# Patient Record
Sex: Female | Born: 1965 | Race: White | Hispanic: No | Marital: Married | Smoking: Never smoker
Health system: Southern US, Community
[De-identification: ages and names within clinical notes are randomized; demographics above are authoritative.]

## PROBLEM LIST (undated history)

## (undated) DIAGNOSIS — C50919 Malignant neoplasm of unspecified site of unspecified female breast: Secondary | ICD-10-CM

## (undated) DIAGNOSIS — Z789 Other specified health status: Secondary | ICD-10-CM

## (undated) DIAGNOSIS — M1990 Unspecified osteoarthritis, unspecified site: Secondary | ICD-10-CM

## (undated) DIAGNOSIS — Z923 Personal history of irradiation: Secondary | ICD-10-CM

## (undated) HISTORY — DX: Unspecified osteoarthritis, unspecified site: M19.90

## (undated) HISTORY — PX: NO PAST SURGERIES: SHX2092

## (undated) HISTORY — DX: Malignant neoplasm of unspecified site of unspecified female breast: C50.919

---

## 2005-11-09 ENCOUNTER — Other Ambulatory Visit: Admission: RE | Admit: 2005-11-09 | Discharge: 2005-11-09 | Payer: Self-pay | Admitting: *Deleted

## 2014-06-11 ENCOUNTER — Other Ambulatory Visit: Payer: Self-pay | Admitting: *Deleted

## 2014-06-11 DIAGNOSIS — N63 Unspecified lump in unspecified breast: Secondary | ICD-10-CM

## 2014-06-17 ENCOUNTER — Other Ambulatory Visit: Payer: Self-pay | Admitting: *Deleted

## 2014-06-17 DIAGNOSIS — N63 Unspecified lump in unspecified breast: Secondary | ICD-10-CM

## 2014-06-20 ENCOUNTER — Ambulatory Visit
Admission: RE | Admit: 2014-06-20 | Discharge: 2014-06-20 | Disposition: A | Discharge status: Home / Self Care | Payer: BC Managed Care – PPO | Source: Ambulatory Visit | Attending: *Deleted | Admitting: *Deleted

## 2014-06-20 ENCOUNTER — Encounter (INDEPENDENT_AMBULATORY_CARE_PROVIDER_SITE_OTHER): Payer: Self-pay

## 2014-06-20 DIAGNOSIS — N63 Unspecified lump in unspecified breast: Secondary | ICD-10-CM

## 2014-06-20 DIAGNOSIS — C50919 Malignant neoplasm of unspecified site of unspecified female breast: Secondary | ICD-10-CM

## 2014-06-20 HISTORY — DX: Malignant neoplasm of unspecified site of unspecified female breast: C50.919

## 2014-06-21 ENCOUNTER — Other Ambulatory Visit: Payer: Self-pay | Admitting: *Deleted

## 2014-06-21 DIAGNOSIS — C50912 Malignant neoplasm of unspecified site of left female breast: Secondary | ICD-10-CM

## 2014-06-27 ENCOUNTER — Telehealth: Payer: Self-pay | Admitting: *Deleted

## 2014-06-27 DIAGNOSIS — C50212 Malignant neoplasm of upper-inner quadrant of left female breast: Secondary | ICD-10-CM

## 2014-06-27 NOTE — Telephone Encounter (Signed)
Confirmed [facility] for 07/03/14 at 12N .  Instructions and contact information given. 

## 2014-07-02 ENCOUNTER — Ambulatory Visit
Admission: RE | Admit: 2014-07-02 | Discharge: 2014-07-02 | Disposition: A | Discharge status: Home / Self Care | Payer: BC Managed Care – PPO | Source: Ambulatory Visit | Attending: *Deleted | Admitting: *Deleted

## 2014-07-02 DIAGNOSIS — C50912 Malignant neoplasm of unspecified site of left female breast: Secondary | ICD-10-CM

## 2014-07-02 MED ORDER — GADOBENATE DIMEGLUMINE 529 MG/ML IV SOLN
20.0000 mL | Freq: Once | INTRAVENOUS | Status: AC | PRN
  Administered 2014-07-02: 20 mL via INTRAVENOUS

## 2014-07-03 ENCOUNTER — Encounter: Payer: Self-pay | Admitting: Physical Therapy

## 2014-07-03 ENCOUNTER — Ambulatory Visit (HOSPITAL_BASED_OUTPATIENT_CLINIC_OR_DEPARTMENT_OTHER): Payer: BC Managed Care – PPO | Admitting: Hematology and Oncology

## 2014-07-03 ENCOUNTER — Encounter: Payer: Self-pay | Admitting: Hematology and Oncology

## 2014-07-03 ENCOUNTER — Ambulatory Visit: Payer: BC Managed Care – PPO | Attending: General Surgery | Admitting: Physical Therapy

## 2014-07-03 ENCOUNTER — Other Ambulatory Visit (HOSPITAL_BASED_OUTPATIENT_CLINIC_OR_DEPARTMENT_OTHER): Payer: BC Managed Care – PPO

## 2014-07-03 ENCOUNTER — Other Ambulatory Visit (INDEPENDENT_AMBULATORY_CARE_PROVIDER_SITE_OTHER): Payer: Self-pay | Admitting: General Surgery

## 2014-07-03 ENCOUNTER — Ambulatory Visit
Admission: RE | Admit: 2014-07-03 | Discharge: 2014-07-03 | Disposition: A | Discharge status: Home / Self Care | Payer: BC Managed Care – PPO | Source: Ambulatory Visit | Attending: Radiation Oncology | Admitting: Radiation Oncology

## 2014-07-03 ENCOUNTER — Other Ambulatory Visit: Payer: Self-pay | Admitting: Hematology and Oncology

## 2014-07-03 ENCOUNTER — Ambulatory Visit: Payer: BC Managed Care – PPO

## 2014-07-03 VITALS — BP 163/97 | HR 80 | Temp 98.4°F | Resp 18 | Ht 66.25 in | Wt 236.1 lb

## 2014-07-03 DIAGNOSIS — Z01818 Encounter for other preprocedural examination: Secondary | ICD-10-CM

## 2014-07-03 DIAGNOSIS — Z170 Estrogen receptor positive status [ER+]: Secondary | ICD-10-CM

## 2014-07-03 DIAGNOSIS — C50212 Malignant neoplasm of upper-inner quadrant of left female breast: Secondary | ICD-10-CM | POA: Insufficient documentation

## 2014-07-03 DIAGNOSIS — M439 Deforming dorsopathy, unspecified: Secondary | ICD-10-CM | POA: Diagnosis not present

## 2014-07-03 DIAGNOSIS — M1990 Unspecified osteoarthritis, unspecified site: Secondary | ICD-10-CM | POA: Diagnosis not present

## 2014-07-03 LAB — COMPREHENSIVE METABOLIC PANEL (CC13)
ALBUMIN: 3.8 g/dL (ref 3.5–5.0)
ALT: 51 U/L (ref 0–55)
AST: 38 U/L — ABNORMAL HIGH (ref 5–34)
Alkaline Phosphatase: 79 U/L (ref 40–150)
Anion Gap: 10 mEq/L (ref 3–11)
BUN: 8.7 mg/dL (ref 7.0–26.0)
CALCIUM: 9.6 mg/dL (ref 8.4–10.4)
CHLORIDE: 103 meq/L (ref 98–109)
CO2: 30 meq/L — AB (ref 22–29)
Creatinine: 0.9 mg/dL (ref 0.6–1.1)
GLUCOSE: 93 mg/dL (ref 70–140)
Potassium: 3.6 mEq/L (ref 3.5–5.1)
SODIUM: 142 meq/L (ref 136–145)
TOTAL PROTEIN: 7.5 g/dL (ref 6.4–8.3)
Total Bilirubin: 0.46 mg/dL (ref 0.20–1.20)

## 2014-07-03 LAB — CBC WITH DIFFERENTIAL/PLATELET
BASO%: 0.7 % (ref 0.0–2.0)
Basophils Absolute: 0.1 10*3/uL (ref 0.0–0.1)
EOS%: 1.6 % (ref 0.0–7.0)
Eosinophils Absolute: 0.1 10*3/uL (ref 0.0–0.5)
HCT: 41.8 % (ref 34.8–46.6)
HGB: 13.7 g/dL (ref 11.6–15.9)
LYMPH#: 1.8 10*3/uL (ref 0.9–3.3)
LYMPH%: 20.8 % (ref 14.0–49.7)
MCH: 28.6 pg (ref 25.1–34.0)
MCHC: 32.7 g/dL (ref 31.5–36.0)
MCV: 87.3 fL (ref 79.5–101.0)
MONO#: 0.9 10*3/uL (ref 0.1–0.9)
MONO%: 9.9 % (ref 0.0–14.0)
NEUT#: 5.9 10*3/uL (ref 1.5–6.5)
NEUT%: 67 % (ref 38.4–76.8)
Platelets: 247 10*3/uL (ref 145–400)
RBC: 4.78 10*6/uL (ref 3.70–5.45)
RDW: 13.6 % (ref 11.2–14.5)
WBC: 8.8 10*3/uL (ref 3.9–10.3)

## 2014-07-03 NOTE — Assessment & Plan Note (Signed)
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 After reviewing the pathology in detail, we proceeded to discuss the different treatment options between surgery, radiation, chemotherapy, antiestrogen therapies.  Recommendation: Surgery followed by Oncotype DX testing if appropriate, followed by radiation and antiestrogen therapy  Oncotype DX counseling:I discussed Oncotype DX test. I explained to the patient that this is a 21 gene panel to evaluate patient tumors DNA to calculate recurrence score. This would help determine whether patient has high risk or intermediate risk or low risk breast cancer. She understands that if her tumor was found to be high risk, she would benefit from systemic chemotherapy. If low risk, no need of chemotherapy. If she was found to be intermediate risk, we would need to evaluate the score as well as other risk factors and determine if an abbreviated chemotherapy may be of benefit.  Return to clinic one week after surgery to discuss final pathology.

## 2014-07-03 NOTE — Therapy (Signed)
Physical Therapy Evaluation  Patient Details  Name: Mary Eaton MRN: [id number] Date of Birth: 1966/07/05  Encounter Date: 07/03/2014      PT End of Session - 07/03/14 1658    Visit Number 1   Number of Visits 1   PT Start Time 1430   PT Stop Time 1500   PT Time Calculation (min) 30 min   Activity Tolerance Patient tolerated treatment well      Past Medical History  Diagnosis Date  . Breast cancer   . Arthritis     History reviewed. No pertinent past surgical history.  There were no vitals taken for this visit.  Visit Diagnosis:  Breast cancer of upper-inner quadrant of left female breast - Plan: PT plan of care cert/re-cert  Postural deformity - Plan: PT plan of care cert/re-cert      Subjective Assessment - 07/03/14 1648    Symptoms Patient was seen today in the breast multi-disciplinary clinic for initial assessments.  She was diagnosed with left breast cancer.   Pertinent History Patient was diagnosed with left upper inner quadrant breast cancer which was ER/PR positive, HER2 negative on 06/28/14.   Currently in Pain? Yes   Pain Score 8    Pain Location Hip   Pain Orientation Left   Pain Descriptors / Indicators Sharp;Jabbing   Pain Type Other (Comment)  intermittent   Pain Onset More than a month ago   Pain Frequency Intermittent   Aggravating Factors  bending and getting up from bending   Pain Relieving Factors changing positions   Effect of Pain on Daily Activities No effect   Multiple Pain Sites No          [facility] PT Assessment - 07/03/14 1600    Assessment   Medical Diagnosis Left breast cancer   Onset Date 06/28/14   Precautions   Precautions Other (comment)  active cancer   Balance Screen   Has the patient fallen in the past 6 months No   Has the patient had a decrease in activity level because of a fear of falling?  No   Is the patient reluctant to leave their home because of a fear of falling?  No   Home Environment   Family/patient  expects to be discharged to: Private residence   Living Arrangements Spouse/significant other;Children   Prior Function   Level of Independence Independent with basic ADLs   Cognition   Overall Cognitive Status Within Functional Limits for tasks assessed   Posture/Postural Control   Posture/Postural Control Postural limitations   Postural Limitations rounded shoulders and forward head posture   AROM   Right Shoulder Extension 51 Degrees   Right Shoulder Flexion 145 Degrees   Right Shoulder ABduction 164 Degrees   Right Shoulder Internal Rotation 62 Degrees   Right Shoulder External Rotation 67 Degrees   Left Shoulder Extension 55 Degrees   Left Shoulder Flexion 147 Degrees   Left Shoulder ABduction 142 Degrees   Left Shoulder Internal Rotation 60 Degrees   Left Shoulder External Rotation 71 Degrees   Strength   Overall Strength Within functional limits for tasks performed            Education - 07/03/14 1658    Education provided Yes   Education Details Post op breast cancer surgery shoulder range of motion home exercise program   Education Details Patient;Spouse   Methods Explanation;Demonstration;Handout   Comprehension Verbalized understanding;Returned demonstration  Plan - 07/03/14 1659    Clinical Impression Statement Patient was seen today in the [facility] for initial assessments.  She was very receptive to all information given.  She had baseline measurements performed for shoulder range of motion, posture, and lymphedema.   Pt will benefit from skilled therapeutic intervention in order to improve on the following deficits Decreased range of motion;Decreased knowledge of precautions;Impaired UE functional use;Increased edema  if needed following surgery.   Rehab Potential Excellent   Clinical Impairments Affecting Rehab Potential none   PT Frequency Other (Comment)  eval only   PT Plan Follow up as needed following surgery.         Problem List Patient Active Problem List   Diagnosis Date Noted  . Breast cancer of upper-inner quadrant of left female breast 07/03/2014            LYMPHEDEMA/ONCOLOGY QUESTIONNAIRE - 07/03/14 1600    Cancer Type Left upper inner quadrant breast cancer   Lymphedema Assessments Upper extremities   10 cm Proximal to Olecranon Process 33.5 cm   Olecranon Process 29.3 cm   10 cm Proximal to Ulnar Styloid Process 25.5. cm   Just Proximal to Ulnar Styloid Process 17.8 cm   Across Hand at [employer] 19.2 cm   At [place] of 2nd Digit 6.6 cm   10 cm Proximal to Olecranon Process 33.1 cm   Olecranon Process 27.2 cm   10 cm Proximal to Ulnar Styloid Process 23.8 cm   Just Proximal to Ulnar Styloid Process 16.9 cm   Across Hand at [employer] 19.8 cm   At [place] of 2nd Digit 6.7 cm       Patient will follow up at outpatient cancer rehab if needed following surgery.  If the patient requires physical therapy at that time, a specific plan will be dictated and sent to the referring physician for approval. The patient was educated today on appropriate basic range of motion exercises to begin post operatively and the importance of attending the After Breast Cancer class following surgery.  Patient was educated today on lymphedema risk reduction practices as it pertains to recommendations that will benefit the patient immediately following surgery.  She verbalized good understanding.  No additional physical therapy is indicated at this time.          Breast Clinic Goals - 07/03/14 1702    Patient will be able to verbalize understanding of pertinent lymphedema risk reduction practices relevant to her diagnosis specifically related to skin care.   Time 1   Period Days   Status Achieved   Patient will be able to return demonstrate and/or verbalize understanding of the post-op home exercise program related to regaining shoulder range of motion.   Time 1   Period Days   Status  Achieved   Patient will be able to verbalize understanding of the importance of attending the postoperative After Breast Cancer Class for further lymphedema risk reduction education and therapeutic exercise.   Time 1   Period Days   Status Achieved                Audry Pecina,MARTI COOPER 07/03/2014, 5:04 PM

## 2014-07-03 NOTE — Patient Instructions (Signed)
Physical Therapy Information for After Breast Cancer Surgery/Treatment:   Lymphedema is a swelling condition that you may be at risk for in your arm if you have lymph nodes removed from the armpit area.  After a sentinel node biopsy, the risk is approximately 5-9% and is higher after an axillary node dissection.  There is treatment available for this condition and it is not life-threatening.  Contact your physician or physical therapist with concerns.  You may begin the 4 shoulder/posture exercises (see additional sheet) when permitted by your physician (typically a week after surgery).  If you have drains, you may need to wait until those are removed before beginning range of motion exercises.  A general recommendation is to not lift your arms above shoulder height until drains are removed.  These exercises should be done to your tolerance and gently.  This is not a "no pain/no gain" type of recovery so listen to your body and stretch into the range of motion that you can tolerate, stopping if you have pain.  If you are having immediate reconstruction, ask your plastic surgeon about doing exercises as he or she may want you to wait.  We encourage you to attend the free one time ABC (After Breast Cancer) class offered by [facility].  You will learn information related to lymphedema risk, prevention and treatment and additional exercises to regain mobility following surgery.  You can call [phone] for more information.  This is offered the 1st and 3rd Monday of each month.  You only attend the class one time.  While undergoing any medical procedure or treatment, try to avoid blood pressure being taken or needle sticks from occurring on the arm on the side of cancer.   This recommendation begins after surgery and continues for the rest of your life.  This may help reduce your risk of getting lymphedema (swelling in your arm).  An excellent resource for those seeking  information on lymphedema is the National Lymphedema Network's web site. It can be accessed at www.lymphnet.org  If you notice swelling in your hand, arm or breast at any time following surgery (even if it is many years from now), please contact your doctor or physical therapist to discuss this.  Lymphedema can be treated at any time but it is easier for you if it is treated early on.  If you feel like your shoulder motion is not returning to normal in a reasonable amount of time, please contact your surgeon or physical therapist.  Marti C. Damaria Stofko, PT, CLT [phone]; [address] [place], [place] [address] ABC CLASS After Breast Cancer Class  After Breast Cancer Class is a specially designed exercise class to assist you in a safe recover after having breast cancer surgery.  In this class you will learn how to get back to full function whether your drains were just removed or if you had surgery a month ago.  This one-time class is held the 1st and 3rd Monday of every month from 11:00 a.m. until 12:00 noon at the [facility] located at [address] [place], [place] [address]  This class is FREE and space is limited. For more information or to register for the next available class, call [phone].  Class Goals   Understand specific stretches to improve the flexibility of you chest and shoulder.  Learn ways to safely strengthen your upper body and improve your posture.  Understand the warning signs of infection and why   you may be at risk for an arm infection.  Learn about Lymphedema and prevention.  ** You do not attend this class until after surgery.  Drains must be removed to participate  Patient was instructed today in a home exercise program today for post op shoulder range of motion. She was encouraged to do these when permitted by her physician. 

## 2014-07-03 NOTE — Progress Notes (Signed)
Checked in new pt with no financial concerns at this time. I informed pt if chemo is part of her treatment Raquel will call her ins to see if Josem Kaufmann is req and will obtain that if it is as well as get in touch with different foundations that offer copay assistance for chemo if needed.  She has Raquel's card for any questions or concerns she may have in the future.

## 2014-07-03 NOTE — Progress Notes (Signed)
[facility] NOTE  Patient Care Team: Jasmine December, NP as PCP - General Jasmine December, NP as Nurse Practitioner Rulon Eisenmenger, MD as Consulting Physician (Hematology and Oncology) Fanny Skates, MD as Consulting Physician (General Surgery) Rexene Edison, MD as Consulting Physician (Radiation Oncology)  CHIEF COMPLAINTS/PURPOSE OF CONSULTATION:  Newly diagnosed breast cancer  HISTORY OF PRESENTING ILLNESS:  Mary Eaton 48 y.o. female is here because of recent diagnosis of left breast cancer. Patient had a screening mammogram that revealed a left breast mass at 11:00 position which by ultrasound measured 1.4 cm. She underwent a biopsy that revealed a grade 1 invasive ductal carcinoma with DCIS, ER PR positive HER-2 negative with a Ki-67 of 80%. MRI of the breasts was performed showed postbiopsy changes and 1.4 cm abnormality in the breast. She was presented this morning at the multidisciplinary tumor board and is here today to discuss the treatment plan at [facility] clinic. She is here today accompanied by her husband.  I reviewed her records extensively and collaborated the history with the patient.  SUMMARY OF ONCOLOGIC HISTORY:   Breast cancer of upper-inner quadrant of left female breast   06/20/2014 Initial Diagnosis Left breast biopsy 11:00: IDC with DCIS grade 1, ER 100%, PR percent, Ki-67 80%, HER-2 negative, ratio 1.4 it   07/02/2014 Breast MRI Left breast irregular mass 11:00 1.4 x 1 x 0.8 cm with non-mass enhancement medially and anteriorly extending 4.5 cm, no lymph nodes    In terms of breast cancer risk profile:  She menarched at early age of 49, on birth control and not having periods  She had 2 pregnancy, her first child was born at age 56  She has received birth control pills for approximately 28 years.  She was never exposed to fertility medications or hormone replacement therapy.  She has no family history of Breast/GYN/GI cancer  MEDICAL HISTORY:   Past Medical History  Diagnosis Date  . Breast cancer   . Arthritis     SURGICAL HISTORY: History reviewed. No pertinent past surgical history.  SOCIAL HISTORY: History   Social History  . Marital Status: Married    Spouse Name: N/A    Number of Children: N/A  . Years of Education: N/A   Occupational History  . Not on file.   Social History Main Topics  . Smoking status: Never Smoker   . Smokeless tobacco: Not on file  . Alcohol Use: No  . Drug Use: No  . Sexual Activity: Not on file   Other Topics Concern  . Not on file   Social History Narrative  . No narrative on file    FAMILY HISTORY: Family History  Problem Relation Age of Onset  . Lung cancer Mother     ALLERGIES:  has No Known Allergies.  MEDICATIONS:  Current Outpatient Prescriptions  Medication Sig Dispense Refill  . ibuprofen (ADVIL,MOTRIN) 100 MG tablet Take 100 mg by mouth every 6 (six) hours as needed for fever.    . norethindrone (JOLIVETTE) 0.35 MG tablet Take 1 tablet by mouth daily.     No current facility-administered medications for this visit.    REVIEW OF SYSTEMS:   Constitutional: Denies fevers, chills or abnormal night sweats Eyes: Denies blurriness of vision, double vision or watery eyes Ears, nose, mouth, throat, and face: Denies mucositis or sore throat Respiratory: Denies cough, dyspnea or wheezes Cardiovascular: Denies palpitation, chest discomfort or lower extremity swelling Gastrointestinal:  Denies nausea, heartburn or change in bowel  habits Skin: Denies abnormal skin rashes Lymphatics: Denies new lymphadenopathy or easy bruising Neurological:Denies numbness, tingling or new weaknesses Behavioral/Psych: Mood is stable, no new changes  Breast:  Denies any palpable lumps or discharge All other systems were reviewed with the patient and are negative.  PHYSICAL EXAMINATION: ECOG PERFORMANCE STATUS: 0 - Asymptomatic  Filed Vitals:   07/03/14 1239  BP: 163/97  Pulse:  80  Temp: 98.4 F (36.9 C)  Resp: 18   Filed Weights   07/03/14 1239  Weight: 236 lb 1.6 oz (107.094 kg)    GENERAL:alert, no distress and comfortable SKIN: skin color, texture, turgor are normal, no rashes or significant lesions EYES: normal, conjunctiva are pink and non-injected, sclera clear OROPHARYNX:no exudate, no erythema and lips, buccal mucosa, and tongue normal  NECK: supple, thyroid normal size, non-tender, without nodularity LYMPH:  no palpable lymphadenopathy in the cervical, axillary or inguinal LUNGS: clear to auscultation and percussion with normal breathing effort HEART: regular rate & rhythm and no murmurs and no lower extremity edema ABDOMEN:abdomen soft, non-tender and normal bowel sounds Musculoskeletal:no cyanosis of digits and no clubbing  PSYCH: alert & oriented x 3 with fluent speech NEURO: no focal motor/sensory deficits BREAST: No palpable nodules in breast. 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 After reviewing the pathology in  detail, we proceeded to discuss the different treatment options between surgery, radiation, chemotherapy, antiestrogen therapies.  Recommendation: Surgery followed by Oncotype DX testing if appropriate, followed by radiation and antiestrogen therapy  Oncotype DX counseling:I discussed Oncotype DX test. I explained to the patient that this is a 21 gene panel to evaluate patient tumors DNA to calculate recurrence score. This would help determine whether patient has high risk or intermediate risk or low risk breast cancer. She understands that if her tumor was found to be high risk, she would benefit from systemic chemotherapy. If low risk, no need of chemotherapy. If she was found to be intermediate risk, we would need to evaluate the score as well as other risk factors and determine if an abbreviated chemotherapy may be of benefit.  Return to clinic one week after surgery to discuss final pathology.   instructed the patient to stop control and no department at about control measures.  All questions were answered. The patient knows to call the clinic with any problems, questions or concerns. I spent 55 minutes counseling the patient face to face. The total time spent in the appointment was 60 minutes and more than 50% was on counseling.     Nicholas Lose  K, MD 07/03/2014 3:16 PM

## 2014-07-03 NOTE — Progress Notes (Signed)
[facility] NEW PATIENT EVALUATION  Name: Mary Eaton MRN: [id number]  Date:   07/03/2014           DOB: [birth year]  Status: outpatient   CC: Jasmine December, NP  Fanny Skates, MD    REFERRING PHYSICIAN: Fanny Skates, MD   DIAGNOSIS: stage I (T1 N0 M0) invasive ductal/DCIS of the left breast   HISTORY OF PRESENT ILLNESS:  Mary Eaton is a 48 y.o. female who is seen today at the [facility] through the courtesy of Dr. Dalbert Batman for evaluation of her T1 N0 invasive ductal/DCIS of the left breast. At the time of a screening mammogram and she was found to have a left breast mass. I do not have her outside reports, but she was found to have a 1.4 cm mass at 11:00 within the left breast on ultrasound. This was biopsied on 06/20/2014 and this was diagnostic for invasive ductal carcinoma/DCIS. Both ER/PR were 100% and Ki-67 was 18%. Breast MR on 07/02/2014 showed a 1.4 cm mass within the left breast at 11:00 with non-mass like enhancement extending anteriorly and medially, felt to represent postbiopsy change. There was no adenopathy. She seen today with Dr. Dalbert Batman and Dr. Lindi Adie.  PREVIOUS RADIATION THERAPY: No   PAST MEDICAL HISTORY:  has a past medical history of Breast cancer and Arthritis.     PAST SURGICAL HISTORY: No past surgical history on file.   FAMILY HISTORY: family history includes Lung cancer in her mother. Her father died of a heart attack at 18 and her mother died from complications of COPD is 44. She had a history of lung cancer. No family history of breast cancer.   SOCIAL HISTORY:  reports that she has never smoked. She does not have any smokeless tobacco history on file. She reports that she does not drink alcohol or use illicit drugs. Married, 2 children. 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   ALLERGIES: Review of patient's allergies indicates no known allergies.   MEDICATIONS:  Current Outpatient Prescriptions  Medication Sig  Dispense Refill  . ibuprofen (ADVIL,MOTRIN) 100 MG tablet Take 100 mg by mouth every 6 (six) hours as needed for fever.    . norethindrone (JOLIVETTE) 0.35 MG tablet Take 1 tablet by mouth daily.     No current facility-administered medications for this encounter.     REVIEW OF SYSTEMS:  Pertinent items are noted in HPI.    PHYSICAL EXAM: Alert and oriented 48 year old white female appearing her stated age. Wt Readings from Last 3 Encounters:  07/03/14 236 lb 1.6 oz (107.094 kg)  07/02/14 230 lb (104.327 kg)   Temp Readings from Last 3 Encounters:  07/03/14 98.4 F (36.9 C) Oral   BP Readings from Last 3 Encounters:  07/03/14 163/97   Pulse Readings from Last 3 Encounters:  07/03/14 80   Head and neck examination: Grossly unremarkable. Nodes: Without palpable cervical, supraclavicular, or axillary lymphadenopathy. Chest: Lungs clear. Breasts: There is ill-defined thickening at approximately 11:00 along the left breast. I do not feel a discrete mass. Right breast without masses or lesions. Extremities: Without edema.    LABORATORY DATA:  Lab Results  Component Value Date   WBC 8.8 07/03/2014   HGB 13.7 07/03/2014   HCT 41.8 07/03/2014   MCV 87.3 07/03/2014   PLT 247 07/03/2014   Lab Results  Component Value Date   NA 142 07/03/2014   K 3.6 07/03/2014   CO2 30* 07/03/2014   Lab Results  Component Value Date   ALT 51 07/03/2014   AST 38* 07/03/2014   ALKPHOS 79 07/03/2014   BILITOT 0.46 07/03/2014      IMPRESSION: stage I (T1 N0 M0) invasive ductal/DCIS of the left breast. We discussed local treatment options which include partial mastectomy followed by radiation therapy or mastectomy with or without reconstruction. She will need a sentinel lymph node biopsy. We discussed hypofractionated treatment and also deep inspiration breath-hold technology. She would be a candidate for both. We discussed the potential acute and late toxicities of radiation therapy. She  desires breast preservation. Dr. Lindi Adie will probably obtain Oncotype DX testing. I can see her a postoperatively.   PLAN: As discussed above.   I spent 30 minutes minutes face to face with the patient and more than 50% of that time was spent in counseling and/or coordination of care.

## 2014-07-04 NOTE — Progress Notes (Signed)
MD not created during office visit sent to scan.  Copy to pt.

## 2014-07-08 ENCOUNTER — Other Ambulatory Visit (INDEPENDENT_AMBULATORY_CARE_PROVIDER_SITE_OTHER): Payer: Self-pay | Admitting: General Surgery

## 2014-07-08 DIAGNOSIS — C50212 Malignant neoplasm of upper-inner quadrant of left female breast: Secondary | ICD-10-CM

## 2014-07-08 DIAGNOSIS — Z01818 Encounter for other preprocedural examination: Secondary | ICD-10-CM

## 2014-07-09 ENCOUNTER — Telehealth: Payer: Self-pay | Admitting: *Deleted

## 2014-07-09 ENCOUNTER — Telehealth: Payer: Self-pay | Admitting: Hematology and Oncology

## 2014-07-09 NOTE — Telephone Encounter (Signed)
Spoke to pt concerning [place] from 07/03/14. Pt denies questions or concerns regarding dx or treatment care plan. Pt is choosing to opt out of genetic counseling at this time. Confirmed surgery date and f/u appt with Dr. Lindi Adie. Encourage pt to call with needs. Received verbal understanding. Contact information given.

## 2014-07-09 NOTE — Telephone Encounter (Signed)
, °

## 2014-07-11 ENCOUNTER — Other Ambulatory Visit: Payer: BC Managed Care – PPO

## 2014-07-12 ENCOUNTER — Encounter (HOSPITAL_BASED_OUTPATIENT_CLINIC_OR_DEPARTMENT_OTHER): Payer: Self-pay | Admitting: *Deleted

## 2014-07-12 NOTE — Progress Notes (Signed)
To go for cxr after seeds 11/16 Labs done 07/03/14

## 2014-07-15 ENCOUNTER — Telehealth: Payer: Self-pay | Admitting: Hematology and Oncology

## 2014-07-15 ENCOUNTER — Ambulatory Visit
Admission: RE | Admit: 2014-07-15 | Discharge: 2014-07-15 | Disposition: A | Discharge status: Home / Self Care | Payer: BC Managed Care – PPO | Source: Ambulatory Visit | Attending: General Surgery | Admitting: General Surgery

## 2014-07-16 NOTE — H&P (Signed)
Mary Eaton  Location: [facility] Patient #: [id number] DOB: May 24, 1966 Undefined / Language: Undefined / Race: Undefined Female    History of Present Illness  The patient is a 48 year old female who presents with breast cancer. This is a 48 year old Caucasian female from [place], [place] in [place]. She is referred by 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 [name] is her PCP in [facility]. Screening mammograms were done in [facility] showing an abnormality. She was referred to the breast center of [facility]. Mammogram and ultrasound show a 1.4 cm mass in the left breast at the 11:00 position, 9 cm from the nipple Image guided biopsy shows mixed invasive and noninvasive ductal cancer, receptor positive, HER-2 negative. MRI shows a 1.4 cm mass in the left breast at 11:00 position, some postbiopsy changes and small hematoma, nodes looks negative. Solitary finding. Right breast normal. Personal history is negative for prior breast problems or health issues. She has mild obesity Family history is negative for breast or ovarian cancer. She is married. Her husband is with her today. 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 She denies tobacco. She has decided for breast conservation surgery. She will be scheduled for left partial mastectomy with radioactive seed localization and left axillary sentinel node biopsy. I discussed the indications, details, techniques, and numerous risk of the surgery with her. She's aware of the risk of bleeding, infection, reoperation for positive margins or positive nodes, cosmetic deformity, skin necrosis, arm swelling, or numbness. She understands all these issues. At this time all of her questions are answered. She agrees with this plan.   Other Problems  Heart murmur  Past Surgical History Breast  Biopsy Left.  Diagnostic Studies History  Colonoscopy never Mammogram within last year Pap Smear 1-5 years ago  Social History  Alcohol use Occasional alcohol use. Caffeine use Carbonated beverages. No drug use Tobacco use Never smoker.  Family History  Alcohol Abuse Mother. Arthritis Mother. Bleeding disorder Father. Diabetes Mellitus Sister. Heart Disease Father. Heart disease in female family member before age 71 Hypertension Father. Melanoma Father, Mother. Respiratory Condition Mother. Thyroid problems Mother.  Pregnancy / Birth History  Age at menarche 89 years. Contraceptive History Depo-provera, Oral contraceptives. Gravida 2 Maternal age 87-30 Para 2  Review of Systems  General Not Present- Appetite Loss, Chills, Fatigue, Fever, Night Sweats, Weight Gain and Weight Loss. Skin Not Present- Change in Wart/Mole, Dryness, Hives, Jaundice, New Lesions, Non-Healing Wounds, Rash and Ulcer. HEENT Not Present- Earache, Hearing Loss, Hoarseness, Nose Bleed, Oral Ulcers, Ringing in the Ears, Seasonal Allergies, Sinus Pain, Sore Throat, Visual Disturbances, Wears glasses/contact lenses and Yellow Eyes. Respiratory Not Present- Bloody sputum, Chronic Cough, Difficulty Breathing, Snoring and Wheezing. Breast Present- Breast Mass. Not Present- Breast Pain, Nipple Discharge and Skin Changes. Cardiovascular Not Present- Chest Pain, Difficulty Breathing Lying Down, Leg Cramps, Palpitations, Rapid Heart Rate, Shortness of Breath and Swelling of Extremities. Gastrointestinal Not Present- Abdominal Pain, Bloating, Bloody Stool, Change in Bowel Habits, Chronic diarrhea, Constipation, Difficulty Swallowing, Excessive gas, Gets full quickly at meals, Hemorrhoids, Indigestion, Nausea, Rectal Pain and Vomiting. Female Genitourinary Not Present- Frequency, Nocturia, Painful Urination, Pelvic Pain and Urgency. Musculoskeletal Present- Joint Pain and Joint Stiffness. Not  Present- Back Pain, Muscle Pain, Muscle Weakness and Swelling of Extremities. Neurological Not Present- Decreased Memory, Fainting, Headaches, Numbness, Seizures, Tingling, Tremor, Trouble  walking and Weakness. Psychiatric Not Present- Anxiety, Bipolar, Change in Sleep Pattern, Depression, Fearful and Frequent crying. Endocrine Not Present- Cold Intolerance, Excessive Hunger, Hair Changes, Heat Intolerance, Hot flashes and New Diabetes. Hematology Not Present- Easy Bruising, Excessive bleeding, Gland problems, HIV and Persistent Infections.   Physical Exam  General Mental Status-Alert. General Appearance-Consistent with stated age. Hydration-Well hydrated. Voice-Normal.  Head and Neck Head-normocephalic, atraumatic with no lesions or palpable masses. Trachea-midline. Thyroid Gland Characteristics - normal size and consistency.  Eye Eyeball - Bilateral-Extraocular movements intact. Sclera/Conjunctiva - Bilateral-No scleral icterus.  Chest and Lung Exam Chest and lung exam reveals -quiet, even and easy respiratory effort with no use of accessory muscles and on auscultation, normal breath sounds, no adventitious sounds and normal vocal resonance. Inspection Chest Wall - Normal. Back - normal.  Breast Breast - Left-Biopsy scar, Non Tender, No Dimpling, No Inflammation, No Lumpectomy scars, No Mastectomy scars, No Peau d' Orange. Breast - Right-Symmetric, Non Tender, No Biopsy scars, no Dimpling, No Inflammation, No Lumpectomy scars, No Mastectomy scars, No Peau d' Orange. Breast Lump-No Palpable Breast Mass. Note: Breasts are medium size, 38 C by report. In the left breast upper inner quadrant there are some ecchymoses in a couple of areas of small hematoma. No other skin change in either breast. No axillary adenopathy.   Cardiovascular Cardiovascular examination reveals -normal heart sounds, regular rate and rhythm with no murmurs and normal pedal pulses  bilaterally.  Abdomen Inspection Inspection of the abdomen reveals - No Hernias. Skin - Scar - no surgical scars. Palpation/Percussion Palpation and Percussion of the abdomen reveal - Soft, Non Tender, No Rebound tenderness, No Rigidity (guarding) and No hepatosplenomegaly. Auscultation Auscultation of the abdomen reveals - Bowel sounds normal.  Neurologic Neurologic evaluation reveals -alert and oriented x 3 with no impairment of recent or remote memory. Mental Status-Normal.  Musculoskeletal Normal Exam - Left-Upper Extremity Strength Normal and Lower Extremity Strength Normal. Normal Exam - Right-Upper Extremity Strength Normal and Lower Extremity Strength Normal.  Lymphatic Head & Neck  General Head & Neck Lymphatics: Bilateral - Description - Normal. Axillary  General Axillary Region: Bilateral - Description - Normal. Tenderness - Non Tender. Femoral & Inguinal  Generalized Femoral & Inguinal Lymphatics: Bilateral - Description - Normal. Tenderness - Non Tender.    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 Dr. Lindi Adie and Dr. Valere Dross have discussed the other forms of treatment that will be appropriate for you. You will be scheduled for left partial mastectomy with radioactive seed localization, and left axillary sentinel node biopsy You will be referred for genetic counseling. Edsel Petrin. Dalbert Batman, M.D., [facility] General and Minimally invasive Surgery Breast and Colorectal Surgery Office:   [phone] Pager:   [phone]

## 2014-07-17 ENCOUNTER — Ambulatory Visit (HOSPITAL_BASED_OUTPATIENT_CLINIC_OR_DEPARTMENT_OTHER): Payer: BC Managed Care – PPO | Admitting: Certified Registered"

## 2014-07-17 ENCOUNTER — Ambulatory Visit (HOSPITAL_BASED_OUTPATIENT_CLINIC_OR_DEPARTMENT_OTHER)
Admission: RE | Admit: 2014-07-17 | Discharge: 2014-07-17 | Disposition: A | Discharge status: Home / Self Care | Payer: BC Managed Care – PPO | Source: Ambulatory Visit | Attending: General Surgery | Admitting: General Surgery

## 2014-07-17 ENCOUNTER — Encounter (HOSPITAL_BASED_OUTPATIENT_CLINIC_OR_DEPARTMENT_OTHER)
Admission: RE | Disposition: A | Discharge status: Home / Self Care | Payer: Self-pay | Source: Ambulatory Visit | Attending: General Surgery

## 2014-07-17 ENCOUNTER — Encounter (HOSPITAL_BASED_OUTPATIENT_CLINIC_OR_DEPARTMENT_OTHER): Payer: Self-pay | Admitting: *Deleted

## 2014-07-17 ENCOUNTER — Ambulatory Visit (HOSPITAL_COMMUNITY)
Admission: RE | Admit: 2014-07-17 | Discharge: 2014-07-17 | Disposition: A | Discharge status: Home / Self Care | Payer: BC Managed Care – PPO | Source: Ambulatory Visit | Attending: General Surgery | Admitting: General Surgery

## 2014-07-17 ENCOUNTER — Ambulatory Visit
Admission: RE | Admit: 2014-07-17 | Discharge: 2014-07-17 | Disposition: A | Discharge status: Home / Self Care | Payer: BC Managed Care – PPO | Source: Ambulatory Visit | Attending: General Surgery | Admitting: General Surgery

## 2014-07-17 DIAGNOSIS — C50212 Malignant neoplasm of upper-inner quadrant of left female breast: Secondary | ICD-10-CM

## 2014-07-17 DIAGNOSIS — Z01818 Encounter for other preprocedural examination: Secondary | ICD-10-CM

## 2014-07-17 DIAGNOSIS — R011 Cardiac murmur, unspecified: Secondary | ICD-10-CM | POA: Insufficient documentation

## 2014-07-17 HISTORY — DX: Other specified health status: Z78.9

## 2014-07-17 HISTORY — PX: RADIOACTIVE SEED GUIDED PARTIAL MASTECTOMY WITH AXILLARY SENTINEL LYMPH NODE BIOPSY: SHX6520

## 2014-07-17 SURGERY — RADIOACTIVE SEED GUIDED PARTIAL MASTECTOMY WITH AXILLARY SENTINEL LYMPH NODE BIOPSY
Anesthesia: General | Site: Breast | Laterality: Left

## 2014-07-17 MED ORDER — SODIUM CHLORIDE 0.9 % IJ SOLN
INTRAMUSCULAR | Status: AC
  Filled 2014-07-17: qty 10

## 2014-07-17 MED ORDER — MIDAZOLAM HCL 2 MG/2ML IJ SOLN
INTRAMUSCULAR | Status: AC
  Filled 2014-07-17: qty 2

## 2014-07-17 MED ORDER — HYDROCODONE-ACETAMINOPHEN 5-325 MG PO TABS: 1.0000 | ORAL_TABLET | Freq: Four times a day (QID) | ORAL | Status: DC | PRN

## 2014-07-17 MED ORDER — SODIUM CHLORIDE 0.9 % IJ SOLN: 3.0000 mL | INTRAMUSCULAR | Status: DC | PRN

## 2014-07-17 MED ORDER — CHLORHEXIDINE GLUCONATE 4 % EX LIQD: 1.0000 "application " | Freq: Once | CUTANEOUS | Status: DC

## 2014-07-17 MED ORDER — FENTANYL CITRATE 0.05 MG/ML IJ SOLN
INTRAMUSCULAR | Status: AC
  Filled 2014-07-17: qty 2

## 2014-07-17 MED ORDER — SCOPOLAMINE 1 MG/3DAYS TD PT72
1.0000 | MEDICATED_PATCH | TRANSDERMAL | Status: DC
  Administered 2014-07-17: 1.5 mg via TRANSDERMAL

## 2014-07-17 MED ORDER — HYDROMORPHONE HCL 1 MG/ML IJ SOLN
0.2500 mg | INTRAMUSCULAR | Status: DC | PRN
  Administered 2014-07-17 (×3): 0.5 mg via INTRAVENOUS

## 2014-07-17 MED ORDER — FENTANYL CITRATE 0.05 MG/ML IJ SOLN
INTRAMUSCULAR | Status: DC | PRN
  Administered 2014-07-17 (×4): 25 ug via INTRAVENOUS

## 2014-07-17 MED ORDER — OXYCODONE HCL 5 MG PO TABS: 5.0000 mg | ORAL_TABLET | ORAL | Status: DC | PRN

## 2014-07-17 MED ORDER — CEFAZOLIN SODIUM-DEXTROSE 2-3 GM-% IV SOLR
2.0000 g | INTRAVENOUS | Status: AC
  Administered 2014-07-17: 2 g via INTRAVENOUS

## 2014-07-17 MED ORDER — HYDROMORPHONE HCL 1 MG/ML IJ SOLN
INTRAMUSCULAR | Status: AC
  Filled 2014-07-17: qty 1

## 2014-07-17 MED ORDER — SODIUM CHLORIDE 0.9 % IJ SOLN: 3.0000 mL | Freq: Two times a day (BID) | INTRAMUSCULAR | Status: DC

## 2014-07-17 MED ORDER — FENTANYL CITRATE 0.05 MG/ML IJ SOLN: 25.0000 ug | INTRAMUSCULAR | Status: DC | PRN

## 2014-07-17 MED ORDER — SCOPOLAMINE 1 MG/3DAYS TD PT72
MEDICATED_PATCH | TRANSDERMAL | Status: AC
  Filled 2014-07-17: qty 1

## 2014-07-17 MED ORDER — ONDANSETRON HCL 4 MG/2ML IJ SOLN
INTRAMUSCULAR | Status: DC | PRN
  Administered 2014-07-17: 4 mg via INTRAVENOUS

## 2014-07-17 MED ORDER — SODIUM CHLORIDE 0.9 % IJ SOLN
INTRAMUSCULAR | Status: DC | PRN
  Administered 2014-07-17: 5 mL

## 2014-07-17 MED ORDER — CEFAZOLIN SODIUM-DEXTROSE 2-3 GM-% IV SOLR: 2.0000 g | INTRAVENOUS | Status: DC

## 2014-07-17 MED ORDER — SODIUM CHLORIDE 0.9 % IV SOLN: INTRAVENOUS | Status: DC

## 2014-07-17 MED ORDER — FENTANYL CITRATE 0.05 MG/ML IJ SOLN
50.0000 ug | INTRAMUSCULAR | Status: DC | PRN
  Administered 2014-07-17: 100 ug via INTRAVENOUS
  Administered 2014-07-17: 50 ug via INTRAVENOUS

## 2014-07-17 MED ORDER — CEFAZOLIN SODIUM-DEXTROSE 2-3 GM-% IV SOLR
INTRAVENOUS | Status: AC
  Filled 2014-07-17: qty 50

## 2014-07-17 MED ORDER — ACETAMINOPHEN 325 MG PO TABS: 650.0000 mg | ORAL_TABLET | ORAL | Status: DC | PRN

## 2014-07-17 MED ORDER — SODIUM CHLORIDE 0.9 % IV SOLN: 250.0000 mL | INTRAVENOUS | Status: DC | PRN

## 2014-07-17 MED ORDER — ONDANSETRON HCL 4 MG/2ML IJ SOLN: 4.0000 mg | Freq: Once | INTRAMUSCULAR | Status: DC | PRN

## 2014-07-17 MED ORDER — DEXAMETHASONE SODIUM PHOSPHATE 4 MG/ML IJ SOLN
INTRAMUSCULAR | Status: DC | PRN
  Administered 2014-07-17: 8 mg via INTRAVENOUS

## 2014-07-17 MED ORDER — OXYCODONE HCL 5 MG PO TABS: 5.0000 mg | ORAL_TABLET | Freq: Once | ORAL | Status: DC | PRN

## 2014-07-17 MED ORDER — OXYCODONE HCL 5 MG/5ML PO SOLN: 5.0000 mg | Freq: Once | ORAL | Status: DC | PRN

## 2014-07-17 MED ORDER — BUPIVACAINE-EPINEPHRINE (PF) 0.5% -1:200000 IJ SOLN
INTRAMUSCULAR | Status: DC | PRN
  Administered 2014-07-17: 25 mL via PERINEURAL

## 2014-07-17 MED ORDER — BUPIVACAINE-EPINEPHRINE (PF) 0.5% -1:200000 IJ SOLN
INTRAMUSCULAR | Status: AC
  Filled 2014-07-17: qty 30

## 2014-07-17 MED ORDER — BUPIVACAINE-EPINEPHRINE 0.25% -1:200000 IJ SOLN
INTRAMUSCULAR | Status: DC | PRN
  Administered 2014-07-17: 10 mL

## 2014-07-17 MED ORDER — LACTATED RINGERS IV SOLN
INTRAVENOUS | Status: DC
  Administered 2014-07-17 (×2): via INTRAVENOUS

## 2014-07-17 MED ORDER — ACETAMINOPHEN 650 MG RE SUPP: 650.0000 mg | RECTAL | Status: DC | PRN

## 2014-07-17 MED ORDER — TECHNETIUM TC 99M SULFUR COLLOID FILTERED
1.0000 | Freq: Once | INTRAVENOUS | Status: AC | PRN
  Administered 2014-07-17: 1 via INTRADERMAL

## 2014-07-17 MED ORDER — METHYLENE BLUE 1 % INJ SOLN
INTRAMUSCULAR | Status: AC
  Filled 2014-07-17: qty 10

## 2014-07-17 MED ORDER — PROPOFOL 10 MG/ML IV BOLUS
INTRAVENOUS | Status: DC | PRN
  Administered 2014-07-17: 200 mg via INTRAVENOUS

## 2014-07-17 MED ORDER — MIDAZOLAM HCL 2 MG/2ML IJ SOLN
1.0000 mg | INTRAMUSCULAR | Status: DC | PRN
  Administered 2014-07-17: 1 mg via INTRAVENOUS
  Administered 2014-07-17: 2 mg via INTRAVENOUS

## 2014-07-17 MED ORDER — LIDOCAINE HCL (CARDIAC) 20 MG/ML IV SOLN
INTRAVENOUS | Status: DC | PRN
  Administered 2014-07-17: 70 mg via INTRAVENOUS

## 2014-07-17 MED ORDER — FENTANYL CITRATE 0.05 MG/ML IJ SOLN
INTRAMUSCULAR | Status: AC
  Filled 2014-07-17: qty 6

## 2014-07-17 SURGICAL SUPPLY — 63 items
APL SKNCLS STERI-STRIP NONHPOA (GAUZE/BANDAGES/DRESSINGS)
APPLIER CLIP 9.375 MED OPEN (MISCELLANEOUS) ×3
APR CLP MED 9.3 20 MLT OPN (MISCELLANEOUS) ×1
BENZOIN TINCTURE PRP APPL 2/3 (GAUZE/BANDAGES/DRESSINGS) IMPLANT
BINDER BREAST LRG (GAUZE/BANDAGES/DRESSINGS) IMPLANT
BINDER BREAST MEDIUM (GAUZE/BANDAGES/DRESSINGS) IMPLANT
BINDER BREAST XLRG (GAUZE/BANDAGES/DRESSINGS) ×2 IMPLANT
BINDER BREAST XXLRG (GAUZE/BANDAGES/DRESSINGS) IMPLANT
BLADE HEX COATED 2.75 (ELECTRODE) ×3 IMPLANT
BLADE SURG 10 STRL SS (BLADE) IMPLANT
BLADE SURG 15 STRL LF DISP TIS (BLADE) ×1 IMPLANT
BLADE SURG 15 STRL SS (BLADE) ×3
CANISTER SUC SOCK COL 7IN (MISCELLANEOUS) IMPLANT
CANISTER SUCT 1200ML W/VALVE (MISCELLANEOUS) ×3 IMPLANT
CHLORAPREP W/TINT 26ML (MISCELLANEOUS) ×3 IMPLANT
CLIP APPLIE 9.375 MED OPEN (MISCELLANEOUS) ×1 IMPLANT
CLOSURE WOUND 1/2 X4 (GAUZE/BANDAGES/DRESSINGS)
COVER BACK TABLE 60X90IN (DRAPES) ×3 IMPLANT
COVER MAYO STAND STRL (DRAPES) ×3 IMPLANT
COVER PROBE W GEL 5X96 (DRAPES) ×3 IMPLANT
DECANTER SPIKE VIAL GLASS SM (MISCELLANEOUS) IMPLANT
DEVICE DUBIN W/COMP PLATE 8390 (MISCELLANEOUS) ×3 IMPLANT
DRAPE LAPAROSCOPIC ABDOMINAL (DRAPES) ×3 IMPLANT
DRAPE UTILITY XL STRL (DRAPES) ×3 IMPLANT
DRSG PAD ABDOMINAL 8X10 ST (GAUZE/BANDAGES/DRESSINGS) IMPLANT
ELECT REM PT RETURN 9FT ADLT (ELECTROSURGICAL) ×3
ELECTRODE REM PT RTRN 9FT ADLT (ELECTROSURGICAL) ×1 IMPLANT
GAUZE SPONGE 4X4 12PLY STRL (GAUZE/BANDAGES/DRESSINGS) IMPLANT
GLOVE BIOGEL PI IND STRL 7.0 (GLOVE) IMPLANT
GLOVE BIOGEL PI INDICATOR 7.0 (GLOVE) ×2
GLOVE ECLIPSE 6.5 STRL STRAW (GLOVE) ×2 IMPLANT
GLOVE EUDERMIC 7 POWDERFREE (GLOVE) ×6 IMPLANT
GOWN STRL REUS W/ TWL LRG LVL3 (GOWN DISPOSABLE) ×2 IMPLANT
GOWN STRL REUS W/ TWL XL LVL3 (GOWN DISPOSABLE) ×1 IMPLANT
GOWN STRL REUS W/TWL LRG LVL3 (GOWN DISPOSABLE) ×6
GOWN STRL REUS W/TWL XL LVL3 (GOWN DISPOSABLE) ×3
KIT MARKER MARGIN INK (KITS) ×3 IMPLANT
LIQUID BAND (GAUZE/BANDAGES/DRESSINGS) ×3 IMPLANT
NDL HYPO 25X1 1.5 SAFETY (NEEDLE) ×2 IMPLANT
NDL SAFETY ECLIPSE 18X1.5 (NEEDLE) ×1 IMPLANT
NEEDLE HYPO 18GX1.5 SHARP (NEEDLE) ×3
NEEDLE HYPO 25X1 1.5 SAFETY (NEEDLE) ×6 IMPLANT
NS IRRIG 1000ML POUR BTL (IV SOLUTION) ×3 IMPLANT
PACK BASIN DAY SURGERY FS (CUSTOM PROCEDURE TRAY) ×3 IMPLANT
PENCIL BUTTON HOLSTER BLD 10FT (ELECTRODE) ×3 IMPLANT
SHEET MEDIUM DRAPE 40X70 STRL (DRAPES) ×2 IMPLANT
SLEEVE SCD COMPRESS KNEE MED (MISCELLANEOUS) ×3 IMPLANT
SPONGE LAP 18X18 X RAY DECT (DISPOSABLE) ×2 IMPLANT
SPONGE LAP 4X18 X RAY DECT (DISPOSABLE) ×3 IMPLANT
STRIP CLOSURE SKIN 1/2X4 (GAUZE/BANDAGES/DRESSINGS) IMPLANT
SUT MNCRL AB 4-0 PS2 18 (SUTURE) ×3 IMPLANT
SUT SILK 2 0 SH (SUTURE) ×3 IMPLANT
SUT VIC AB 2-0 CT1 27 (SUTURE)
SUT VIC AB 2-0 CT1 TAPERPNT 27 (SUTURE) IMPLANT
SUT VIC AB 3-0 SH 27 (SUTURE)
SUT VIC AB 3-0 SH 27X BRD (SUTURE) IMPLANT
SUT VICRYL 3-0 CR8 SH (SUTURE) ×3 IMPLANT
SYRINGE 10CC LL (SYRINGE) ×6 IMPLANT
TOWEL OR 17X24 6PK STRL BLUE (TOWEL DISPOSABLE) ×3 IMPLANT
TOWEL OR NON WOVEN STRL DISP B (DISPOSABLE) ×3 IMPLANT
TUBE CONNECTING 20'X1/4 (TUBING) ×1
TUBE CONNECTING 20X1/4 (TUBING) ×2 IMPLANT
YANKAUER SUCT BULB TIP NO VENT (SUCTIONS) ×3 IMPLANT

## 2014-07-17 NOTE — Anesthesia Procedure Notes (Addendum)
Procedure Name: LMA Insertion Date/Time: 07/17/2014 8:36 AM Performed by: Denna Haggard D Pre-anesthesia Checklist: Patient identified, Emergency Drugs available, Suction available and Patient being monitored Patient Re-evaluated:Patient Re-evaluated prior to inductionOxygen Delivery Method: Circle System Utilized Preoxygenation: Pre-oxygenation with 100% oxygen Intubation Type: IV induction Ventilation: Mask ventilation without difficulty LMA: LMA inserted LMA Size: 4.0 Number of attempts: 1 Airway Equipment and Method: bite block Placement Confirmation: positive ETCO2 Tube secured with: Tape Dental Injury: Teeth and Oropharynx as per pre-operative assessment    Anesthesia Regional Block:  Pectoralis block  Pre-Anesthetic Checklist: ,, timeout performed, Correct Patient, Correct Site, Correct Laterality, Correct Procedure, Correct Position, site marked, Risks and benefits discussed,  Surgical consent,  Pre-op evaluation,  At surgeon's request and post-op pain management  Laterality: Left and Upper  Prep: chloraprep       Needles:  Injection technique: Single-shot  Needle Type: Echogenic Needle     Needle Length: 9cm 9 cm Needle Gauge: 21 and 21 G    Additional Needles:  Procedures: ultrasound guided (picture in chart) Pectoralis block Narrative:  Start time: 07/17/2014 8:23 AM End time: 07/17/2014 8:29 AM Injection made incrementally with aspirations every 5 mL.  Performed by: Personally

## 2014-07-17 NOTE — Interval H&P Note (Signed)
History and Physical Interval Note:  07/17/2014 7:46 AM  Mary Eaton  has presented today for surgery, with the diagnosis of cancer left breast  The goals and the various methods of treatment have been discussed with the patient and family. After consideration of risks, benefits and other options for treatment, the patient has consented to  Procedure(s): LEFT PARTIAL MASTECTOMY WITH RADIOACTIVE SEED LOCALIZATION, LEFT AXILLARY SENTINEL LYMPH NODE BIOPSY (Left) as a surgical intervention .  The patient's history has been reviewed, patient examined today, no change in status, stable for surgery.  I have reviewed the patient's chart and labs.  Questions were answered to the patient's satisfaction.     Adin Hector

## 2014-07-17 NOTE — Discharge Instructions (Signed)
[facility] °Office Phone Number [phone] ° °BREAST BIOPSY/ PARTIAL MASTECTOMY: POST OP INSTRUCTIONS ° °Always review your discharge instruction sheet given to you by the facility where your surgery was performed. ° °IF YOU HAVE DISABILITY OR FAMILY LEAVE FORMS, YOU MUST BRING THEM TO THE OFFICE FOR PROCESSING.  DO NOT GIVE THEM TO YOUR DOCTOR. ° °1. A prescription for pain medication may be given to you upon discharge.  Take your pain medication as prescribed, if needed.  If narcotic pain medicine is not needed, then you may take acetaminophen (Tylenol) or ibuprofen (Advil) as needed. °2. Take your usually prescribed medications unless otherwise directed °3. If you need a refill on your pain medication, please contact your pharmacy.  They will contact our office to request authorization.  Prescriptions will not be filled after 5pm or on week-ends. °4. You should eat very light the first 24 hours after surgery, such as soup, crackers, pudding, etc.  Resume your normal diet the day after surgery. °5. Most patients will experience some swelling and bruising in the breast.  Ice packs and a good support bra will help.  Swelling and bruising can take several days to resolve.  °6. It is common to experience some constipation if taking pain medication after surgery.  Increasing fluid intake and taking a stool softener will usually help or prevent this problem from occurring.  A mild laxative (Milk of Magnesia or Miralax) should be taken according to package directions if there are no bowel movements after 48 hours. °7. Unless discharge instructions indicate otherwise, you may remove your bandages 24-48 hours after surgery, and you may shower at that time.  You may have steri-strips (small skin tapes) in place directly over the incision.  These strips should be left on the skin for 7-10 days.  If your surgeon used skin glue on the incision, you may shower in 24 hours.  The glue will flake off over the  next 2-3 weeks.  Any sutures or staples will be removed at the office during your follow-up visit. °8. ACTIVITIES:  You may resume regular daily activities (gradually increasing) beginning the next day.  Wearing a good support bra or sports bra minimizes pain and swelling.  You may have sexual intercourse when it is comfortable. °a. You may drive when you no longer are taking prescription pain medication, you can comfortably wear a seatbelt, and you can safely maneuver your car and apply brakes. °b. RETURN TO WORK:  ______________________________________________________________________________________ °9. You should see your doctor in the office for a follow-up appointment approximately two weeks after your surgery.  Your doctor’s nurse will typically make your follow-up appointment when she calls you with your pathology report.  Expect your pathology report 2-3 business days after your surgery.  You may call to check if you do not hear from us after three days. °10. OTHER INSTRUCTIONS: _______________________________________________________________________________________________ _____________________________________________________________________________________________________________________________________ °_____________________________________________________________________________________________________________________________________ °_____________________________________________________________________________________________________________________________________ ° °WHEN TO CALL YOUR DOCTOR: °1. Fever over 101.0 °2. Nausea and/or vomiting. °3. Extreme swelling or bruising. °4. Continued bleeding from incision. °5. Increased pain, redness, or drainage from the incision. ° °The clinic staff is available to answer your questions during regular business hours.  Please don’t hesitate to call and ask to speak to one of the nurses for clinical concerns.  If you have a medical emergency, go to the nearest  emergency room or call 911.  A surgeon from [facility] is always on call at the hospital. ° °For further questions, please visit [email or web address]  ° ° °  Post Anesthesia Home Care Instructions ° °Activity: °Get plenty of rest for the remainder of the day. A responsible adult should stay with you for 24 hours following the procedure.  °For the next 24 hours, DO NOT: °-Drive a car °-Operate machinery °-Drink alcoholic beverages °-Take any medication unless instructed by your physician °-Make any legal decisions or sign important papers. ° °Meals: °Start with liquid foods such as gelatin or soup. Progress to regular foods as tolerated. Avoid greasy, spicy, heavy foods. If nausea and/or vomiting occur, drink only clear liquids until the nausea and/or vomiting subsides. Call your physician if vomiting continues. ° °Special Instructions/Symptoms: °Your throat may feel dry or sore from the anesthesia or the breathing tube placed in your throat during surgery. If this causes discomfort, gargle with warm salt water. The discomfort should disappear within 24 hours. ° °

## 2014-07-17 NOTE — Progress Notes (Signed)
Assisted Dr. Crews with left, ultrasound guided, pectoralis block. Side rails up, monitors on throughout procedure. See vital signs in flow sheet. Tolerated Procedure well. 

## 2014-07-17 NOTE — Transfer of Care (Signed)
Immediate Anesthesia Transfer of Care Note  Patient: Mary Eaton  Procedure(s) Performed: Procedure(s) (LRB): LEFT PARTIAL MASTECTOMY WITH RADIOACTIVE SEED LOCALIZATION, LEFT AXILLARY SENTINEL LYMPH NODE BIOPSY (Left)  Patient Location: PACU  Anesthesia Type: General  Level of Consciousness: awake, oriented, sedated and patient cooperative  Airway & Oxygen Therapy: Patient Spontanous Breathing and Patient connected to face mask oxygen  Post-op Assessment: Report given to PACU RN and Post -op Vital signs reviewed and stable  Post vital signs: Reviewed and stable  Complications: No apparent anesthesia complications

## 2014-07-17 NOTE — Anesthesia Preprocedure Evaluation (Signed)

## 2014-07-17 NOTE — Anesthesia Postprocedure Evaluation (Signed)
  Anesthesia Post-op Note  Patient: Mary Eaton  Procedure(s) Performed: Procedure(s): LEFT PARTIAL MASTECTOMY WITH RADIOACTIVE SEED LOCALIZATION, LEFT AXILLARY SENTINEL LYMPH NODE BIOPSY (Left)  Patient Location: PACU  Anesthesia Type: General   Level of Consciousness: awake, alert  and oriented  Airway and Oxygen Therapy: Patient Spontanous Breathing  Post-op Pain: mild  Post-op Assessment: Post-op Vital signs reviewed  Post-op Vital Signs: Reviewed  Last Vitals:  Filed Vitals:   07/17/14 1100  BP: 121/76  Pulse: 71  Temp:   Resp: 13    Complications: No apparent anesthesia complications

## 2014-07-17 NOTE — Op Note (Addendum)
Patient Name:           Mary Eaton   Date of Surgery:        07/17/2014  Pre op Diagnosis:      Invasive and noninvasive ductal carcinoma left breast, upper inner quadrant, receptor positive, HER-2 negative, clinical stage TIc, N0  Post op Diagnosis:    same  Procedure:                 Inject blue dye left breast next line left partial mastectomy with radioactive seed localization Left axillary sentinel node biopsy.  Surgeon:                     Edsel Petrin. Dalbert Batman, M.D., FACS  Assistant:                      OR staff   Operative Indications:  This is a 48 year old Caucasian female from [place], [place] in [place]. She is referred by 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 [name] is her PCP in [facility]. Screening mammograms were done in [facility] showing an abnormality. She was referred to the breast center of [facility]. Mammogram and ultrasound show a 1.4 cm mass in the left breast at the 11:00 position, 9 cm from the nipple Image guided biopsy shows mixed invasive and noninvasive ductal cancer, receptor positive, HER-2 negative. MRI shows a 1.4 cm mass in the left breast at 11:00 position, some postbiopsy changes and small hematoma, nodes looks negative. Solitary finding. Right breast normal. Personal history is negative for prior breast problems or health issues.  Family history is negative for breast or ovarian cancer. 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 She denies tobacco. She has decided for breast conservation surgery. She will be scheduled for left partial mastectomy with radioactive seed localization and left axillary sentinel node biopsy.    Operative Findings:       Radioactivity was identified in the left breast in the holding area. The lumpectomy specimen contained a single marker clip and a single  radioactive seed which appear to be in the center of the specimen. I found two sentinel lymph nodes, both hot and blue.  Procedure in Detail:          The patient underwent radioactive seed placement 2 days ago by the radiologist. In the holding area identify the radioactivity using the neoprobe. Radionuclide was injected into the left breast by the nuclear medicine technician. The patient was taken the operating room and underwent general anesthesia with LMA device. Surgical timeout was performed and antibiotics were given. Following alcohol prep I injected 5 mL of blue dye in the left breast, subareolar area. The breast was massaged for a few minutes.      Left breast and axilla were then prepped and draped in a sterile fashion. 0.5% Marcaine with epinephrine was used as local infiltration anesthetic. Using the neoprobe I identified the area of maximal radioactivity at about 11:00 position of the left breast. A circumareolar, curvilinear incision was made in this area. Using the neoprobe frequently I dissected down around the radioactive marker. The specimen was removed and marked with silk sutures and marked with the 6 color ink kit. The specimen mammogram looked good as described above. Hemostasis was excellent. I lost a little bit of blood  in this wound but it was completely hemostatic at the end.     Transverse incision was made in the left axilla at the hairline. Dissection was carried down through the clavipectoral fascia. I  Identified 2 sentinel lymph nodes, both very hot and blue. There were no other sentinel nodes. Hemostasis was excellent. The lumpectomy incision was marked at 5 cardinal positions with metal clips to orient the radiation oncologist. Both incisions were closed in layers with 3-0 Vicryl sutures for the breast tissue and running 4-0 Monocryl subcuticular for the skin. Dermabond was placed. Breast binder was placed. Patient tolerated procedure well and was taken to PACU in stable  condition. EBL 30 mL. Counts correct. Complications none.     Edsel Petrin. Dalbert Batman, M.D., FACS General and Minimally Invasive Surgery Breast and Colorectal Surgery  07/17/2014 9:38 AM

## 2014-07-18 ENCOUNTER — Encounter (HOSPITAL_BASED_OUTPATIENT_CLINIC_OR_DEPARTMENT_OTHER): Payer: Self-pay | Admitting: General Surgery

## 2014-07-18 NOTE — Addendum Note (Signed)
Addendum  created 07/18/14 1224 by Tawni Millers, CRNA   Modules edited: Charges VN

## 2014-07-19 ENCOUNTER — Telehealth (INDEPENDENT_AMBULATORY_CARE_PROVIDER_SITE_OTHER): Payer: Self-pay | Admitting: General Surgery

## 2014-07-19 NOTE — Telephone Encounter (Signed)
Pathology report shows invasive cancer, focally present at the superior margin. Sentinel nodes are negative. I called the patient. She is doing well surgically. I told her that we would need to re-excise the superior margin. We will bring her into the office early next week for examination of the lumpectomy site and planning the reexcision. I spoke to Deltana in the clinic and she is going to call Ms. Falzone and arrange an appointment next week.  Edsel Petrin. Dalbert Batman, M.D., [facility] General and Minimally invasive Surgery Breast and Colorectal Surgery Office:   [phone] Pager:   [phone]

## 2014-07-23 ENCOUNTER — Encounter: Payer: Self-pay | Admitting: *Deleted

## 2014-07-23 ENCOUNTER — Other Ambulatory Visit (INDEPENDENT_AMBULATORY_CARE_PROVIDER_SITE_OTHER): Payer: Self-pay | Admitting: General Surgery

## 2014-07-23 NOTE — Progress Notes (Signed)
Received order from Dr. Lindi Adie for oncotype testing. Requisition sent to pathology.

## 2014-07-24 ENCOUNTER — Ambulatory Visit: Payer: BC Managed Care – PPO | Admitting: Hematology and Oncology

## 2014-07-24 ENCOUNTER — Encounter (HOSPITAL_BASED_OUTPATIENT_CLINIC_OR_DEPARTMENT_OTHER): Payer: Self-pay | Admitting: *Deleted

## 2014-07-26 ENCOUNTER — Ambulatory Visit: Payer: BC Managed Care – PPO | Admitting: Hematology and Oncology

## 2014-07-29 NOTE — H&P (Signed)
Mary Eaton  Location: [facility] Patient #: [id number] DOB: Dec 29, 1965 Married / Language: English / Race: White Female      History of Present Illness  Patient words: breast eval.  The patient is a 48 year old female who presents with breast cancer. This is a 48 year old female who returns for a postop visit for her left breast cancer. On July 17, 2014 she underwent left partial mastectomy with radioactive seed localization and left axillary sentinel node biopsy. The lumpectomy specimen looked good with the marker clip and seed in the center of the specimen. 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 Lymph nodes were negative. Stage TIc, N0, (IA). She is pleased with the cosmetic result and the wound healing. She is aware that she will need a reexcision and is disappointed about that She has an appointment to see Dr. Lindi Adie tomorrow, and I encouraged her to go ahead and keep that appointment.   Other Problems  Breast Cancer  Past Surgical History  Breast Biopsy Left. Breast Mass; Local Excision Left. Sentinel Lymph Node Biopsy  Diagnostic Studies History Mammogram within last year Pap Smear 1-5 years ago  Allergies \ No Known Drug Allergies11/24/2015  Medication History Jolivette (0.35MG  Tablet, Oral) Active.  Social History  Alcohol use Occasional alcohol use. Caffeine use Carbonated beverages. No drug use Tobacco use Never smoker.  Family History Alcohol Abuse Mother. Arthritis Mother. Diabetes Mellitus Sister. Heart Disease Father. Hypertension Brother, Father. Respiratory Condition Mother. Thyroid problems Mother.  Pregnancy / Birth History  Age at menarche 25 years, 43 years. Contraceptive History Depo-provera, Oral contraceptives. Gravida 2 Irregular periods Maternal age 31-30 Para 2  Review of Systems General Present- Fatigue.  Not Present- Appetite Loss, Chills, Fever, Night Sweats, Weight Gain and Weight Loss. Skin Not Present- Change in Wart/Mole, Dryness, Hives, Jaundice, New Lesions, Non-Healing Wounds, Rash and Ulcer. HEENT Not Present- Earache, Hearing Loss, Hoarseness, Nose Bleed, Oral Ulcers, Ringing in the Ears, Seasonal Allergies, Sinus Pain, Sore Throat, Visual Disturbances, Wears glasses/contact lenses and Yellow Eyes. Respiratory Not Present- Bloody sputum, Chronic Cough, Difficulty Breathing, Snoring and Wheezing. Breast Present- Breast Pain. Not Present- Breast Mass, Nipple Discharge and Skin Changes. Cardiovascular Not Present- Chest Pain, Difficulty Breathing Lying Down, Leg Cramps, Palpitations, Rapid Heart Rate, Shortness of Breath and Swelling of Extremities. Gastrointestinal Not Present- Abdominal Pain, Bloating, Bloody Stool, Change in Bowel Habits, Chronic diarrhea, Constipation, Difficulty Swallowing, Excessive gas, Gets full quickly at meals, Hemorrhoids, Indigestion, Nausea, Rectal Pain and Vomiting. Female Genitourinary Not Present- Frequency, Nocturia, Painful Urination, Pelvic Pain and Urgency. Musculoskeletal Not Present- Back Pain, Joint Pain, Joint Stiffness, Muscle Pain, Muscle Weakness and Swelling of Extremities. Neurological Not Present- Decreased Memory, Fainting, Headaches, Numbness, Seizures, Tingling, Tremor, Trouble walking and Weakness. Psychiatric Not Present- Anxiety, Bipolar, Change in Sleep Pattern, Depression, Fearful and Frequent crying. Endocrine Not Present- Cold Intolerance, Excessive Hunger, Hair Changes, Heat Intolerance, Hot flashes and New Diabetes. Hematology Not Present- Easy Bruising, Excessive bleeding, Gland problems, HIV and Persistent Infections.   Vital 07/23/2014 10:33 AM Weight: 238 lb Height: 67in Body Surface Area: 2.26 m Body Mass Index: 37.28 kg/m Temp.: 97.76F(Temporal)  Pulse: 72 (Regular)  BP: 130/76 (Sitting, Left Arm,  Standard)    Physical Exam General Note: Very pleasant. No distress. A little depressed because she will have to have another operation, which is appropriate   Breast Note: Left breast incision superiorly and left axillary incisions look very good. No  hematoma or infection. Excellent cosmetic result.     Assessment & Plan PRIMARY CANCER OF UPPER OUTER QUADRANT OF LEFT FEMALE BREAST (174.4  C50.412) Current Plans  Schedule for Surgery Your left breast lumpectomy incision and left axillary incision are healing normally. The final pathology report shows 1.7 cm diameter invasive cancer, receptor positive and negative lymph nodes. There is invasive cancer focally present at the superior margin. This margin will need to be reexcised to be sure we did not leave any cancer behind Keep your appointments with Dr. Lindi Adie My office will schedule the surgery as an urgent procedure. We have discussed the indications, techniques, and risks of this surgery in detail

## 2014-07-31 ENCOUNTER — Encounter (HOSPITAL_BASED_OUTPATIENT_CLINIC_OR_DEPARTMENT_OTHER)
Admission: RE | Disposition: A | Discharge status: Home / Self Care | Payer: Self-pay | Source: Ambulatory Visit | Attending: General Surgery

## 2014-07-31 ENCOUNTER — Ambulatory Visit (HOSPITAL_BASED_OUTPATIENT_CLINIC_OR_DEPARTMENT_OTHER)
Admission: RE | Admit: 2014-07-31 | Discharge: 2014-07-31 | Disposition: A | Discharge status: Home / Self Care | Payer: BC Managed Care – PPO | Source: Ambulatory Visit | Attending: General Surgery | Admitting: General Surgery

## 2014-07-31 ENCOUNTER — Encounter (HOSPITAL_BASED_OUTPATIENT_CLINIC_OR_DEPARTMENT_OTHER): Payer: Self-pay | Admitting: Anesthesiology

## 2014-07-31 ENCOUNTER — Ambulatory Visit (HOSPITAL_BASED_OUTPATIENT_CLINIC_OR_DEPARTMENT_OTHER): Payer: BC Managed Care – PPO | Admitting: Anesthesiology

## 2014-07-31 DIAGNOSIS — C50412 Malignant neoplasm of upper-outer quadrant of left female breast: Secondary | ICD-10-CM | POA: Diagnosis present

## 2014-07-31 DIAGNOSIS — C50212 Malignant neoplasm of upper-inner quadrant of left female breast: Secondary | ICD-10-CM

## 2014-07-31 HISTORY — PX: RE-EXCISION OF BREAST CANCER,SUPERIOR MARGINS: SHX6047

## 2014-07-31 SURGERY — RE-EXCISION OF BREAST CANCER,SUPERIOR MARGINS
Anesthesia: General | Site: Breast | Laterality: Left

## 2014-07-31 MED ORDER — SODIUM CHLORIDE 0.9 % IJ SOLN: 3.0000 mL | INTRAMUSCULAR | Status: DC | PRN

## 2014-07-31 MED ORDER — FENTANYL CITRATE 0.05 MG/ML IJ SOLN
INTRAMUSCULAR | Status: AC
  Filled 2014-07-31: qty 6

## 2014-07-31 MED ORDER — PROPOFOL 10 MG/ML IV BOLUS
INTRAVENOUS | Status: AC
  Filled 2014-07-31: qty 20

## 2014-07-31 MED ORDER — HYDROCODONE-ACETAMINOPHEN 5-325 MG PO TABS: 1.0000 | ORAL_TABLET | Freq: Four times a day (QID) | ORAL | Status: DC | PRN

## 2014-07-31 MED ORDER — ACETAMINOPHEN 650 MG RE SUPP: 650.0000 mg | RECTAL | Status: DC | PRN

## 2014-07-31 MED ORDER — CEFAZOLIN SODIUM-DEXTROSE 2-3 GM-% IV SOLR
2.0000 g | INTRAVENOUS | Status: AC
  Administered 2014-07-31: 2 g via INTRAVENOUS

## 2014-07-31 MED ORDER — SODIUM CHLORIDE 0.9 % IV SOLN: INTRAVENOUS | Status: DC

## 2014-07-31 MED ORDER — ONDANSETRON HCL 4 MG/2ML IJ SOLN: 4.0000 mg | Freq: Once | INTRAMUSCULAR | Status: DC | PRN

## 2014-07-31 MED ORDER — MIDAZOLAM HCL 2 MG/2ML IJ SOLN
INTRAMUSCULAR | Status: AC
  Filled 2014-07-31: qty 2

## 2014-07-31 MED ORDER — BUPIVACAINE-EPINEPHRINE (PF) 0.5% -1:200000 IJ SOLN
INTRAMUSCULAR | Status: AC
  Filled 2014-07-31: qty 30

## 2014-07-31 MED ORDER — MIDAZOLAM HCL 5 MG/5ML IJ SOLN
INTRAMUSCULAR | Status: DC | PRN
  Administered 2014-07-31: 2 mg via INTRAVENOUS

## 2014-07-31 MED ORDER — CEFAZOLIN SODIUM-DEXTROSE 2-3 GM-% IV SOLR
INTRAVENOUS | Status: AC
  Filled 2014-07-31: qty 50

## 2014-07-31 MED ORDER — OXYCODONE HCL 5 MG PO TABS: 5.0000 mg | ORAL_TABLET | ORAL | Status: DC | PRN

## 2014-07-31 MED ORDER — MIDAZOLAM HCL 2 MG/2ML IJ SOLN: 1.0000 mg | INTRAMUSCULAR | Status: DC | PRN

## 2014-07-31 MED ORDER — OXYCODONE HCL 5 MG PO TABS
5.0000 mg | ORAL_TABLET | Freq: Once | ORAL | Status: AC | PRN
  Administered 2014-07-31: 5 mg via ORAL

## 2014-07-31 MED ORDER — LIDOCAINE HCL (CARDIAC) 20 MG/ML IV SOLN
INTRAVENOUS | Status: DC | PRN
  Administered 2014-07-31: 80 mg via INTRAVENOUS

## 2014-07-31 MED ORDER — SODIUM CHLORIDE 0.9 % IJ SOLN: 3.0000 mL | Freq: Two times a day (BID) | INTRAMUSCULAR | Status: DC

## 2014-07-31 MED ORDER — PROPOFOL 10 MG/ML IV BOLUS
INTRAVENOUS | Status: DC | PRN
  Administered 2014-07-31: 200 mg via INTRAVENOUS

## 2014-07-31 MED ORDER — LACTATED RINGERS IV SOLN
INTRAVENOUS | Status: DC
  Administered 2014-07-31: 12:00:00 via INTRAVENOUS

## 2014-07-31 MED ORDER — BUPIVACAINE-EPINEPHRINE 0.5% -1:200000 IJ SOLN
INTRAMUSCULAR | Status: DC | PRN
  Administered 2014-07-31: 10 mL

## 2014-07-31 MED ORDER — DEXAMETHASONE SODIUM PHOSPHATE 4 MG/ML IJ SOLN
INTRAMUSCULAR | Status: DC | PRN
  Administered 2014-07-31: 10 mg via INTRAVENOUS

## 2014-07-31 MED ORDER — FENTANYL CITRATE 0.05 MG/ML IJ SOLN: 50.0000 ug | INTRAMUSCULAR | Status: DC | PRN

## 2014-07-31 MED ORDER — CHLORHEXIDINE GLUCONATE 4 % EX LIQD: 1.0000 "application " | Freq: Once | CUTANEOUS | Status: DC

## 2014-07-31 MED ORDER — ONDANSETRON HCL 4 MG/2ML IJ SOLN
INTRAMUSCULAR | Status: DC | PRN
  Administered 2014-07-31: 4 mg via INTRAVENOUS

## 2014-07-31 MED ORDER — FENTANYL CITRATE 0.05 MG/ML IJ SOLN: 25.0000 ug | INTRAMUSCULAR | Status: DC | PRN

## 2014-07-31 MED ORDER — HYDROMORPHONE HCL 1 MG/ML IJ SOLN: 0.2500 mg | INTRAMUSCULAR | Status: DC | PRN

## 2014-07-31 MED ORDER — FENTANYL CITRATE 0.05 MG/ML IJ SOLN
INTRAMUSCULAR | Status: DC | PRN
  Administered 2014-07-31 (×2): 50 ug via INTRAVENOUS
  Administered 2014-07-31: 100 ug via INTRAVENOUS

## 2014-07-31 MED ORDER — ACETAMINOPHEN 325 MG PO TABS: 650.0000 mg | ORAL_TABLET | ORAL | Status: DC | PRN

## 2014-07-31 MED ORDER — SODIUM CHLORIDE 0.9 % IV SOLN: 250.0000 mL | INTRAVENOUS | Status: DC | PRN

## 2014-07-31 MED ORDER — SCOPOLAMINE 1 MG/3DAYS TD PT72
1.0000 | MEDICATED_PATCH | TRANSDERMAL | Status: DC
  Administered 2014-07-31: 1.5 mg via TRANSDERMAL

## 2014-07-31 MED ORDER — OXYCODONE HCL 5 MG PO TABS
ORAL_TABLET | ORAL | Status: AC
  Filled 2014-07-31: qty 1

## 2014-07-31 MED ORDER — SCOPOLAMINE 1 MG/3DAYS TD PT72
MEDICATED_PATCH | TRANSDERMAL | Status: AC
  Filled 2014-07-31: qty 1

## 2014-07-31 MED ORDER — OXYCODONE HCL 5 MG/5ML PO SOLN: 5.0000 mg | Freq: Once | ORAL | Status: AC | PRN

## 2014-07-31 SURGICAL SUPPLY — 64 items
APL SKNCLS STERI-STRIP NONHPOA (GAUZE/BANDAGES/DRESSINGS)
APPLIER CLIP 9.375 MED OPEN (MISCELLANEOUS) ×3
APR CLP MED 9.3 20 MLT OPN (MISCELLANEOUS) ×1
BANDAGE ELASTIC 6 VELCRO ST LF (GAUZE/BANDAGES/DRESSINGS) IMPLANT
BENZOIN TINCTURE PRP APPL 2/3 (GAUZE/BANDAGES/DRESSINGS) IMPLANT
BINDER BREAST XXLRG (GAUZE/BANDAGES/DRESSINGS) ×2 IMPLANT
BLADE HEX COATED 2.75 (ELECTRODE) ×3 IMPLANT
BLADE SURG 15 STRL LF DISP TIS (BLADE) ×2 IMPLANT
BLADE SURG 15 STRL SS (BLADE) ×6
CANISTER SUCT 1200ML W/VALVE (MISCELLANEOUS) ×3 IMPLANT
CHLORAPREP W/TINT 26ML (MISCELLANEOUS) ×3 IMPLANT
CLIP APPLIE 9.375 MED OPEN (MISCELLANEOUS) ×1 IMPLANT
CLOSURE WOUND 1/2 X4 (GAUZE/BANDAGES/DRESSINGS)
COVER BACK TABLE 60X90IN (DRAPES) ×3 IMPLANT
COVER MAYO STAND STRL (DRAPES) ×3 IMPLANT
DECANTER SPIKE VIAL GLASS SM (MISCELLANEOUS) IMPLANT
DRAPE LAPAROSCOPIC ABDOMINAL (DRAPES) IMPLANT
DRAPE LAPAROTOMY TRNSV 102X78 (DRAPE) IMPLANT
DRAPE PED LAPAROTOMY (DRAPES) ×3 IMPLANT
DRAPE UTILITY XL STRL (DRAPES) ×3 IMPLANT
ELECT REM PT RETURN 9FT ADLT (ELECTROSURGICAL) ×3
ELECTRODE REM PT RTRN 9FT ADLT (ELECTROSURGICAL) ×1 IMPLANT
GAUZE SPONGE 4X4 16PLY XRAY LF (GAUZE/BANDAGES/DRESSINGS) IMPLANT
GLOVE BIOGEL PI IND STRL 7.5 (GLOVE) IMPLANT
GLOVE BIOGEL PI INDICATOR 7.5 (GLOVE) ×2
GLOVE EUDERMIC 7 POWDERFREE (GLOVE) ×7 IMPLANT
GLOVE EXAM NITRILE MD LF STRL (GLOVE) ×2 IMPLANT
GLOVE SURG SS PI 7.5 STRL IVOR (GLOVE) ×2 IMPLANT
GOWN STRL REUS W/ TWL LRG LVL3 (GOWN DISPOSABLE) ×1 IMPLANT
GOWN STRL REUS W/ TWL XL LVL3 (GOWN DISPOSABLE) ×1 IMPLANT
GOWN STRL REUS W/TWL LRG LVL3 (GOWN DISPOSABLE) ×3
GOWN STRL REUS W/TWL XL LVL3 (GOWN DISPOSABLE) ×3
KIT MARKER MARGIN INK (KITS) ×2 IMPLANT
LIQUID BAND (GAUZE/BANDAGES/DRESSINGS) IMPLANT
NDL HYPO 25X1 1.5 SAFETY (NEEDLE) ×1 IMPLANT
NEEDLE HYPO 22GX1.5 SAFETY (NEEDLE) IMPLANT
NEEDLE HYPO 25X1 1.5 SAFETY (NEEDLE) ×3 IMPLANT
NS IRRIG 1000ML POUR BTL (IV SOLUTION) ×3 IMPLANT
PACK BASIN DAY SURGERY FS (CUSTOM PROCEDURE TRAY) ×3 IMPLANT
PENCIL BUTTON HOLSTER BLD 10FT (ELECTRODE) ×3 IMPLANT
SLEEVE SCD COMPRESS KNEE MED (MISCELLANEOUS) ×3 IMPLANT
SPONGE GAUZE 4X4 12PLY STER LF (GAUZE/BANDAGES/DRESSINGS) IMPLANT
SPONGE LAP 4X18 X RAY DECT (DISPOSABLE) ×3 IMPLANT
STAPLER VISISTAT 35W (STAPLE) IMPLANT
STRIP CLOSURE SKIN 1/2X4 (GAUZE/BANDAGES/DRESSINGS) IMPLANT
SUT ETHILON 4 0 PS 2 18 (SUTURE) IMPLANT
SUT MNCRL AB 4-0 PS2 18 (SUTURE) IMPLANT
SUT SILK 2 0 SH (SUTURE) ×3 IMPLANT
SUT VIC AB 2-0 CT1 27 (SUTURE) ×3
SUT VIC AB 2-0 CT1 TAPERPNT 27 (SUTURE) ×1 IMPLANT
SUT VIC AB 2-0 SH 27 (SUTURE)
SUT VIC AB 2-0 SH 27XBRD (SUTURE) IMPLANT
SUT VIC AB 3-0 FS2 27 (SUTURE) IMPLANT
SUT VIC AB 4-0 P-3 18XBRD (SUTURE) IMPLANT
SUT VIC AB 4-0 P3 18 (SUTURE)
SUT VICRYL 3-0 CR8 SH (SUTURE) ×3 IMPLANT
SUT VICRYL 4-0 PS2 18IN ABS (SUTURE) IMPLANT
SYR BULB 3OZ (MISCELLANEOUS) IMPLANT
SYRINGE 10CC LL (SYRINGE) ×3 IMPLANT
TAPE HYPAFIX 4 X10 (GAUZE/BANDAGES/DRESSINGS) IMPLANT
TOWEL OR NON WOVEN STRL DISP B (DISPOSABLE) ×3 IMPLANT
TUBE CONNECTING 20'X1/4 (TUBING) ×1
TUBE CONNECTING 20X1/4 (TUBING) ×2 IMPLANT
YANKAUER SUCT BULB TIP NO VENT (SUCTIONS) ×3 IMPLANT

## 2014-07-31 NOTE — Anesthesia Preprocedure Evaluation (Signed)

## 2014-07-31 NOTE — Interval H&P Note (Signed)
History and Physical Interval Note:  07/31/2014 12:09 PM  Mary Eaton  has presented today for surgery, with the diagnosis of Breast cancer  The various methods of treatment have been discussed with the patient and family. After consideration of risks, benefits and other options for treatment, the patient has consented to  Procedure(s): RE-EXCISION OF BREAST [place] (N/A) as a surgical intervention .  The patient's history has been reviewed, patient examined, no change in status, stable for surgery.  I have reviewed the patient's chart and labs.  Questions were answered to the patient's satisfaction.     Adin Hector

## 2014-07-31 NOTE — Discharge Instructions (Signed)
[facility] Office Phone Number [phone]  BREAST BIOPSY/ PARTIAL MASTECTOMY: POST OP INSTRUCTIONS  Always review your discharge instruction sheet given to you by the facility where your surgery was performed.  IF YOU HAVE DISABILITY OR FAMILY LEAVE FORMS, YOU MUST BRING THEM TO THE OFFICE FOR PROCESSING.  DO NOT GIVE THEM TO YOUR DOCTOR.  1. A prescription for pain medication may be given to you upon discharge.  Take your pain medication as prescribed, if needed.  If narcotic pain medicine is not needed, then you may take acetaminophen (Tylenol) or ibuprofen (Advil) as needed. 2. Take your usually prescribed medications unless otherwise directed 3. If you need a refill on your pain medication, please contact your pharmacy.  They will contact our office to request authorization.  Prescriptions will not be filled after 5pm or on week-ends. 4. You should eat very light the first 24 hours after surgery, such as soup, crackers, pudding, etc.  Resume your normal diet the day after surgery. 5. Most patients will experience some swelling and bruising in the breast.  Ice packs and a good support bra will help.  Swelling and bruising can take several days to resolve.  6. It is common to experience some constipation if taking pain medication after surgery.  Increasing fluid intake and taking a stool softener will usually help or prevent this problem from occurring.  A mild laxative (Milk of Magnesia or Miralax) should be taken according to package directions if there are no bowel movements after 48 hours. 7. Unless discharge instructions indicate otherwise, you may remove your bandages 24-48 hours after surgery, and you may shower at that time.  You may have steri-strips (small skin tapes) in place directly over the incision.  These strips should be left on the skin for 7-10 days.  If your surgeon used skin glue on the incision, you may shower in 24 hours.  The glue will flake off over the  next 2-3 weeks.  Any sutures or staples will be removed at the office during your follow-up visit. 8. ACTIVITIES:  You may resume regular daily activities (gradually increasing) beginning the next day.  Wearing a good support bra or sports bra minimizes pain and swelling.  You may have sexual intercourse when it is comfortable. a. You may drive when you no longer are taking prescription pain medication, you can comfortably wear a seatbelt, and you can safely maneuver your car and apply brakes. b. RETURN TO WORK:  ______________________________________________________________________________________ 9. You should see your doctor in the office for a follow-up appointment approximately two weeks after your surgery.  Your doctors nurse will typically make your follow-up appointment when she calls you with your pathology report.  Expect your pathology report 2-3 business days after your surgery.  You may call to check if you do not hear from [place] after three days. 10. OTHER INSTRUCTIONS: _______________________________________________________________________________________________ _____________________________________________________________________________________________________________________________________ _____________________________________________________________________________________________________________________________________ _____________________________________________________________________________________________________________________________________  WHEN TO CALL YOUR DOCTOR: 1. Fever over 101.0 2. Nausea and/or vomiting. 3. Extreme swelling or bruising. 4. Continued bleeding from incision. 5. Increased pain, redness, or drainage from the incision.  The clinic staff is available to answer your questions during regular business hours.  Please dont hesitate to call and ask to speak to one of the nurses for clinical concerns.  If you have a medical emergency, go to the nearest  emergency room or call 911.  A surgeon from [facility] is always on call at the hospital.  For further questions, please visit [email or web address] °Office Phone Number [phone] ° °BREAST BIOPSY/ PARTIAL MASTECTOMY: POST OP INSTRUCTIONS ° °Always review your discharge instruction sheet given to you by the facility where your surgery was performed. ° °IF YOU HAVE DISABILITY OR FAMILY LEAVE FORMS, YOU MUST BRING THEM TO THE OFFICE FOR PROCESSING.  DO NOT GIVE THEM TO YOUR DOCTOR. ° °11. A prescription for pain medication may be given to you upon discharge.  Take your pain medication as prescribed, if needed.  If narcotic pain medicine is not needed, then you may take acetaminophen (Tylenol) or ibuprofen (Advil) as needed. °12. Take your usually prescribed medications unless otherwise directed °13. If you need a refill on your pain medication, please contact your pharmacy.  They will contact our office to request authorization.  Prescriptions will not be filled after 5pm or on week-ends. °14. You should eat very light the first 24 hours after surgery, such as soup, crackers, pudding, etc.  Resume your normal diet the day after surgery. °15. Most patients will experience some swelling and bruising in the breast.  Ice packs and a good support bra will help.  Swelling and bruising can take several days to resolve.  °16. It is common to experience some constipation if taking pain medication after surgery.  Increasing fluid intake and taking a stool softener will usually help or prevent this problem from occurring.  A mild laxative (Milk of Magnesia or Miralax) should be taken according to package directions if there are no bowel movements after 48 hours. °17. Unless discharge instructions indicate otherwise, you may remove your bandages 24-48 hours after surgery, and you may shower at that time.  You may have steri-strips (small skin tapes) in place directly over the  incision.  These strips should be left on the skin for 7-10 days.  If your surgeon used skin glue on the incision, you may shower in 24 hours.  The glue will flake off over the next 2-3 weeks.  Any sutures or staples will be removed at the office during your follow-up visit. °18. ACTIVITIES:  You may resume regular daily activities (gradually increasing) beginning the next day.  Wearing a good support bra or sports bra minimizes pain and swelling.  You may have sexual intercourse when it is comfortable. °a. You may drive when you no longer are taking prescription pain medication, you can comfortably wear a seatbelt, and you can safely maneuver your car and apply brakes. °b. RETURN TO WORK:  ______________________________________________________________________________________ °19. You should see your doctor in the office for a follow-up appointment approximately two weeks after your surgery.  Your doctor’s nurse will typically make your follow-up appointment when she calls you with your pathology report.  Expect your pathology report 2-3 business days after your surgery.  You may call to check if you do not hear from us after three days. °20. OTHER INSTRUCTIONS: _______________________________________________________________________________________________ _____________________________________________________________________________________________________________________________________ °_____________________________________________________________________________________________________________________________________ °_____________________________________________________________________________________________________________________________________ ° °WHEN TO CALL YOUR DOCTOR: °6. Fever over 101.0 °7. Nausea and/or vomiting. °8. Extreme swelling or bruising. °9. Continued bleeding from incision. °10. Increased pain, redness, or drainage from the incision. ° °The clinic staff is available to answer your questions  during regular business hours.  Please don’t hesitate to call and ask to speak to one of the nurses for clinical concerns.  If you have a medical emergency, go to the nearest emergency room or call 911.  A surgeon from [facility] is always on call at the hospital. ° °For further questions, please visit [email or web address]  ° ° °  Post Anesthesia Home Care Instructions  Activity: Get plenty of rest for the remainder of the day. A responsible adult should stay with you for 24 hours following the procedure.  For the next 24 hours, DO NOT: -Drive a car -Paediatric nurse -Drink alcoholic beverages -Take any medication unless instructed by your physician -Make any legal decisions or sign important papers.  Meals: Start with liquid foods such as gelatin or soup. Progress to regular foods as tolerated. Avoid greasy, spicy, heavy foods. If nausea and/or vomiting occur, drink only clear liquids until the nausea and/or vomiting subsides. Call your physician if vomiting continues.  Special Instructions/Symptoms: Your throat may feel dry or sore from the anesthesia or the breathing tube placed in your throat during surgery. If this causes discomfort, gargle with warm salt water. The discomfort should disappear within 24 hours.   Call your surgeon if you experience:   1.  Fever over 101.0. 2.  Inability to urinate. 3.  Nausea and/or vomiting. 4.  Extreme swelling or bruising at the surgical site. 5.  Continued bleeding from the incision. 6.  Increased pain, redness or drainage from the incision. 7.  Problems related to your pain medication. 8. Any change in color, movement and/or sensation 9. Any problems and/or concerns

## 2014-07-31 NOTE — Op Note (Signed)
Patient Name:           Mary Eaton   Date of Surgery:        07/31/2014  Pre op Diagnosis:      Invasive carcinoma left breast, upper outer quadrant Status post lumpectomy and sentinel node biopsy Focally positive superior margin  Post op Diagnosis:    Same  Procedure:                 Left breast partial mastectomy, reexcision superior margin  Surgeon:                     Edsel Petrin. Dalbert Batman, M.D., FACS  Assistant:                      OR staff  Operative Indications:    This is a 48 year old female who returns for a postop visit for her left breast cancer. On July 17, 2014 she underwent left partial mastectomy with radioactive seed localization and left axillary sentinel node biopsy. The lumpectomy specimen looked good with the marker clip and seed in the center of the specimen. 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 Lymph nodes were negative. Stage TIc, N0, (IA). She is pleased with the cosmetic result and the wound healing. She is aware that she will need a reexcision and is disappointed about that She has an appointment to see Dr. Lindi Adie tomorrow, and I encouraged her to go ahead and keep that appointment.  Operative Findings:       The left breast lumpectomy cavity was clean and there was no gross evidence of cancer. We excised the superior margin  Procedure in Detail:          Following the induction of general LMA anesthesia the patient's left breast was prepped and draped in a sterile fashion, surgical timeout was performed and intravenous antibiotics were given. 0.5% Marcaine with epinephrine was used as a local infiltration anesthetic.     The curvilinear incision in the upper outer quadrant of the left breast was re-incised. We slowly dissected down until we entered the seroma cavity. We removed all of the old Vicryl sutures. We saw no evidence of cancer. Using knife and electrocautery I excised a  4 cm x 4 cm x 8 mm piece of breast tissue which encompassed the superior rim. The new superior margin was marked with red ink and preservative sprayed on the specimen and the specimen sent fresh to the lab with the appropriate clinical history. The wound was irrigated with saline and hemostasis was excellent. The breast tissue was reapproximated with 3-0 Vicryl sutures and the skin closed with a running subcuticular 4-0 Monocryl suture and Dermabond. Breast binder was placed. The patient was taken to PACU in stable condition. EBL 10 mL. Counts correct. Complications none.     Edsel Petrin. Dalbert Batman, M.D., FACS General and Minimally Invasive Surgery Breast and Colorectal Surgery  07/31/2014 1:35 PM

## 2014-07-31 NOTE — Transfer of Care (Signed)
Immediate Anesthesia Transfer of Care Note  Patient: Mary Eaton  Procedure(s) Performed: Procedure(s): RE-EXCISION OF BREAST CANCER,SUPERIOR MARGINS (Left)  Patient Location: PACU  Anesthesia Type:General  Level of Consciousness: awake, alert  and oriented  Airway & Oxygen Therapy: Patient Spontanous Breathing and Patient connected to face mask oxygen  Post-op Assessment: Report given to PACU RN, Post -op Vital signs reviewed and stable and Patient moving all extremities  Post vital signs: Reviewed and stable  Complications: No apparent anesthesia complications

## 2014-07-31 NOTE — Anesthesia Procedure Notes (Signed)
Procedure Name: LMA Insertion Date/Time: 07/31/2014 1:08 PM Performed by: Baxter Flattery Pre-anesthesia Checklist: Patient identified, Emergency Drugs available, Suction available and Patient being monitored Patient Re-evaluated:Patient Re-evaluated prior to inductionOxygen Delivery Method: Circle System Utilized Preoxygenation: Pre-oxygenation with 100% oxygen Intubation Type: IV induction Ventilation: Mask ventilation without difficulty LMA: LMA inserted LMA Size: 4.0 Number of attempts: 1 Airway Equipment and Method: bite block Placement Confirmation: positive ETCO2 and breath sounds checked- equal and bilateral Tube secured with: Tape Dental Injury: Teeth and Oropharynx as per pre-operative assessment

## 2014-07-31 NOTE — Anesthesia Postprocedure Evaluation (Signed)
  Anesthesia Post-op Note  Patient: Mary Eaton  Procedure(s) Performed: Procedure(s): RE-EXCISION OF BREAST CANCER,SUPERIOR MARGINS (Left)  Patient Location: PACU  Anesthesia Type: General   Level of Consciousness: awake, alert  and oriented  Airway and Oxygen Therapy: Patient Spontanous Breathing  Post-op Pain: mild  Post-op Assessment: Post-op Vital signs reviewed  Post-op Vital Signs: Reviewed  Last Vitals:  Filed Vitals:   07/31/14 1443  BP: 131/70  Pulse: 67  Temp:   Resp: 16    Complications: No apparent anesthesia complications

## 2014-08-01 ENCOUNTER — Encounter: Payer: Self-pay | Admitting: *Deleted

## 2014-08-01 ENCOUNTER — Ambulatory Visit: Payer: BC Managed Care – PPO | Admitting: Hematology and Oncology

## 2014-08-01 ENCOUNTER — Telehealth: Payer: Self-pay | Admitting: Hematology and Oncology

## 2014-08-01 NOTE — Telephone Encounter (Signed)
per pof to sch pt appt-cld & spoke to pt and gave pt appt time & date °

## 2014-08-02 ENCOUNTER — Encounter (HOSPITAL_BASED_OUTPATIENT_CLINIC_OR_DEPARTMENT_OTHER): Payer: Self-pay | Admitting: General Surgery

## 2014-08-02 NOTE — Progress Notes (Signed)
Quick Note:  Inform patient of Pathology report,. Margin reexcision shows no residual cancer.is is excellent news. She will not need any further surgery. I will discuss with her in detail at next visit.  hmi ______

## 2014-08-02 NOTE — Progress Notes (Signed)
Completed and documented in allscripts.

## 2014-08-05 ENCOUNTER — Encounter (HOSPITAL_COMMUNITY): Payer: Self-pay

## 2014-08-07 ENCOUNTER — Encounter: Payer: Self-pay | Admitting: *Deleted

## 2014-08-07 NOTE — Progress Notes (Signed)
Received oncotype score of 8/6%. Copy given to Dr. Lindi Adie. Original to HIM

## 2014-08-14 ENCOUNTER — Telehealth: Payer: Self-pay | Admitting: Hematology and Oncology

## 2014-08-14 ENCOUNTER — Encounter: Payer: Self-pay | Admitting: Radiation Oncology

## 2014-08-14 ENCOUNTER — Ambulatory Visit
Admission: RE | Admit: 2014-08-14 | Discharge: 2014-08-14 | Disposition: A | Discharge status: Home / Self Care | Payer: BLUE CROSS/BLUE SHIELD | Source: Ambulatory Visit | Attending: Radiation Oncology | Admitting: Radiation Oncology

## 2014-08-14 ENCOUNTER — Ambulatory Visit (HOSPITAL_BASED_OUTPATIENT_CLINIC_OR_DEPARTMENT_OTHER): Payer: BC Managed Care – PPO | Admitting: Hematology and Oncology

## 2014-08-14 VITALS — BP 142/88 | HR 71 | Temp 98.2°F | Resp 16 | Ht 66.0 in | Wt 240.2 lb

## 2014-08-14 DIAGNOSIS — Z170 Estrogen receptor positive status [ER+]: Secondary | ICD-10-CM

## 2014-08-14 DIAGNOSIS — C50212 Malignant neoplasm of upper-inner quadrant of left female breast: Secondary | ICD-10-CM | POA: Insufficient documentation

## 2014-08-14 DIAGNOSIS — Z510 Encounter for antineoplastic radiation therapy: Secondary | ICD-10-CM | POA: Diagnosis present

## 2014-08-14 MED ORDER — TAMOXIFEN CITRATE 20 MG PO TABS: 20.0000 mg | ORAL_TABLET | Freq: Every day | ORAL | Status: DC

## 2014-08-14 NOTE — Progress Notes (Signed)
Patient Care Team: Jasmine December, NP as PCP - General Jasmine December, NP as Nurse Practitioner Rulon Eisenmenger, MD as Consulting Physician (Hematology and Oncology) Fanny Skates, MD as Consulting Physician (General Surgery) Rexene Edison, MD as Consulting Physician (Radiation Oncology)  DIAGNOSIS: Breast cancer of upper-inner quadrant of left female breast   Staging form: Breast, AJCC 7th Edition     Clinical stage from 07/03/2014: Stage IA (T1, N0, M0) - Unsigned       Staging comments: Staged at breast conference on 11.4.15      Pathologic stage from 07/17/2014: Stage IA (T1c, N0, cM0) - Unsigned   SUMMARY OF ONCOLOGIC HISTORY:   Breast cancer of upper-inner quadrant of left female breast   06/20/2014 Initial Diagnosis Left breast biopsy 11:00: IDC with DCIS grade 1, ER 100%, PR percent, Ki-67 80%, HER-2 negative, ratio 1.4 it   07/02/2014 Breast MRI Left breast irregular mass 11:00 1.4 x 1 x 0.8 cm with non-mass enhancement medially and anteriorly extending 4.5 cm, no lymph nodes   07/17/2014 Surgery Left breast lumpectomy: IDC grade 1, 1.7 cm, DCIS intermediate grade, for positive margin she underwent resection of the margin which was negative, to SLN negative, ER 100%, PR 100%, HER-2 negative ratio 1.48, Ki-67 18% T1 cN0 M0 Oncotype score 8 (6% ROR)    CHIEF COMPLIANT: Follow-up of left breast cancer and Oncotype DX test  INTERVAL HISTORY: Mary Eaton is a 48 year old lady with above-mentioned history of left-sided breast cancer treated with lumpectomy. She had a stage IA breast cancer that was ER/PR positive HER-2 negative. She underwent Oncotype DX testing as she is here today to discuss the result. She reports no major problems or concerns. She is healing very well from the surgery. She has an appointment to see radiation call you today.  REVIEW OF SYSTEMS:   Constitutional: Denies fevers, chills or abnormal weight loss Eyes: Denies blurriness of vision Ears, nose, mouth,  throat, and face: Denies mucositis or sore throat Respiratory: Denies cough, dyspnea or wheezes Cardiovascular: Denies palpitation, chest discomfort or lower extremity swelling Gastrointestinal:  Denies nausea, heartburn or change in bowel habits Skin: Denies abnormal skin rashes Lymphatics: Denies new lymphadenopathy or easy bruising Neurological:Denies numbness, tingling or new weaknesses Behavioral/Psych: Mood is stable, no new changes  Breast:  denies any pain or lumps or nodules in either breasts All other systems were reviewed with the patient and are negative.  I have reviewed the past medical history, past surgical history, social history and family history with the patient and they are unchanged from previous note.  ALLERGIES:  has No Known Allergies.  MEDICATIONS:  Current Outpatient Prescriptions  Medication Sig Dispense Refill  . HYDROcodone-acetaminophen (NORCO) 5-325 MG per tablet Take 1-2 tablets by mouth every 6 (six) hours as needed for moderate pain or severe pain. 30 tablet 0  . HYDROcodone-acetaminophen (NORCO) 5-325 MG per tablet Take 1-2 tablets by mouth every 6 (six) hours as needed for moderate pain or severe pain. 30 tablet 0  . ibuprofen (ADVIL,MOTRIN) 100 MG tablet Take 100 mg by mouth every 6 (six) hours as needed for fever.    . norethindrone (JOLIVETTE) 0.35 MG tablet Take 1 tablet by mouth daily.    . tamoxifen (NOLVADEX) 20 MG tablet Take 1 tablet (20 mg total) by mouth daily. 90 tablet 3   No current facility-administered medications for this visit.    PHYSICAL EXAMINATION: ECOG PERFORMANCE STATUS: 0 - Asymptomatic  Filed Vitals:   08/14/14 1546  BP:  151/84  Pulse: 70  Temp: 98.5 F (36.9 C)  Resp: 20   Filed Weights   08/14/14 1546  Weight: 239 lb 11.2 oz (108.727 kg)    GENERAL:alert, no distress and comfortable SKIN: skin color, texture, turgor are normal, no rashes or significant lesions EYES: normal, Conjunctiva are pink and  non-injected, sclera clear OROPHARYNX:no exudate, no erythema and lips, buccal mucosa, and tongue normal  NECK: supple, thyroid normal size, non-tender, without nodularity LYMPH:  no palpable lymphadenopathy in the cervical, axillary or inguinal LUNGS: clear to auscultation and percussion with normal breathing effort HEART: regular rate & rhythm and no murmurs and no lower extremity edema ABDOMEN:abdomen soft, non-tender and normal bowel sounds Musculoskeletal:no cyanosis of digits and no clubbing  NEURO: alert & oriented x 3 with fluent speech, no focal motor/sensory deficits  LABORATORY DATA:  I have reviewed the data as listed   Chemistry      Component Value Date/Time   NA 142 07/03/2014 1211   K 3.6 07/03/2014 1211   CO2 30* 07/03/2014 1211   BUN 8.7 07/03/2014 1211   CREATININE 0.9 07/03/2014 1211      Component Value Date/Time   CALCIUM 9.6 07/03/2014 1211   ALKPHOS 79 07/03/2014 1211   AST 38* 07/03/2014 1211   ALT 51 07/03/2014 1211   BILITOT 0.46 07/03/2014 1211       Lab Results  Component Value Date   WBC 8.8 07/03/2014   HGB 13.7 07/03/2014   HCT 41.8 07/03/2014   MCV 87.3 07/03/2014   PLT 247 07/03/2014   NEUTROABS 5.9 07/03/2014     ASSESSMENT & PLAN:  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 I discussed with her the risks and benefits of antiestrogen therapy.  Recommendation: Tamoxifen 20 mg daily  Tamoxifen counseling:We discussed the risks and benefits of tamoxifen. These include but not limited to insomnia, hot flashes,  mood changes, vaginal dryness, and weight gain. Although rare, serious side effects including endometrial cancer, risk of blood clots were also discussed. We strongly believe that the benefits far outweigh the risks. Patient understands these risks and consented to starting treatment. Planned treatment duration is 5-10 years.  Patient to return back to see as one month after starting tamoxifen. She will be starting radiation therapy probably in January 2016 with Dr. Valere Dross and she has an appointment to see him today.       No orders of the defined types were placed in this encounter.   The patient has a good understanding of the overall plan. she agrees with it. She will call with any problems that may develop before her next visit here.   Rulon Eisenmenger, MD 08/14/2014 4:25 PM

## 2014-08-14 NOTE — Progress Notes (Signed)
CC: Dr. Haywood Ingram, Dr. Vinay Gudena  Follow-up note:  Diagnosis: Stage I A (T1 N0 M0) invasive ductal/DCIS of the left breast  History: Mary Eaton is a pleasant 48-year-old female who is seen today for review and scheduling of her radiation therapy in the management of her T1 N0 invasive ductal/DCIS of the left breast. At the time of a screening mammogram and she was found to have a left breast mass. I do not have her outside reports, but she was found to have a 1.4 cm mass at 11:00 within the left breast on ultrasound. This was biopsied on 06/20/2014 and this was diagnostic for invasive ductal carcinoma/DCIS. Both ER/PR were 100% and Ki-67 was 18%. Breast MR on 07/02/2014 showed a 1.4 cm mass within the left breast at 11:00 with non-mass like enhancement extending anteriorly and medially, felt to represent postbiopsy change. There was no adenopathy.  On 07/17/2014 Dr. Ingram performed a left partial mastectomy and sentinel lymph node biopsy.  She was found to have a 1.7 cm grade 1 invasive ductal carcinoma with intermediate grade DCIS.  There was invasive carcinoma focally present at the superior margin.  2 sentinel lymph nodes were free of metastatic disease.  On 07/31/2014 she underwent a reexcision of the superior margin and there was no evidence for residual carcinoma.  She underwent Oncotype DX testing and she was found to have a score of 8, low risk.  She was seen by Dr. Gudena earlier today.  She is without complaints today.  Physical examination: Alert and oriented 48-year-old white female appearing her stated age. Filed Vitals:   08/14/14 1632  BP: 142/88  Pulse: 71  Temp: 98.2 F (36.8 C)  Resp: 16   Head and neck examination: Grossly unremarkable.  Nodes: Without palpable cervical, supraclavicular, or axillary lymphadenopathy.  Left axillary wound is healing well.  Breasts: There is a partial mastectomy wound along the periareolar region within the upper inner quadrant of left  breast extending from 10 to 12:00.  No masses are appreciated.  Right breast without masses or lesions.  Impression:  Stage I A (T1 N0 M0) invasive ductal/DCIS of the left breast.  I explained to the patient and her husband that her local treatment options include mastectomy versus partial mastectomy followed by radiation therapy.  She desires breast preservation.  She appears to be a candidate for hypofractionated radiation therapy.  We discussed the potential acute and late toxicities of radiation therapy.  We also discussed the possibility of deep inspiration/breath-hold technology to avoid cardiac irradiation.  Consent is signed today.  She will return for simulation/treatment planning in early January.  Her prognosis is excellent.  Plan: As above.  30 minutes was spent face-to-face with the patient, primarily counseling the patient and coordinating her care. 

## 2014-08-14 NOTE — Progress Notes (Signed)
Location of Breast Cancer: left upper inner  Histology per Pathology Report:  07/31/14 Diagnosis Breast, excision, Left new superior margin - HEALING BIOPSY SITE. - THERE IS NO EVIDENCE OF MALIGNANCY.  06/1814 Diagnosis 1. Breast, lumpectomy, left - INVASIVE DUCTAL CARCINOMA, GRADE I/III, SPANNING 1.7 CM. - DUCTAL CARCINOMA IN SITU, INTERMEDIATE GRADE. - INVASIVE CARCINOMA IS FOCALLY PRESENT AT THE SUPERIOR MARGIN. - SEE ONCOLOGY TABLE BELOW. 2. Lymph node, sentinel, biopsy, left - THERE IS NO EVIDENCE OF CARCINOMA IN 1 OF 1 LYMPH NODE (0/1). - SEE COMMENT. 3. Lymph node, sentinel, biopsy, left - THERE IS NO EVIDENCE OF CARCINOMA IN 1 OF 1 LYMPH NODE (0/1). - SEE COMMENT.  Receptor Status: ER(100%), PR (100%), Her2-neu (-)  Did patient present with symptoms (if so, please note symptoms) or was this found on screening mammography?: screening mammogram  Past/Anticipated interventions by surgeon, if any: Dr Dalbert Batman- left lumpectomy, re-excision  Past/Anticipated interventions by medical oncology, if any: Chemotherapy Dr Lindi Adie: discussed Oncotype DX test. I explained to the patient that this is a 21 gene panel to evaluate patient tumors DNA to calculate recurrence score. This would help determine whether patient has high risk or intermediate risk or low risk breast cancer. She understands that if her tumor was found to be high risk, she would benefit from systemic chemotherapy. If low risk, no need of chemotherapy. If she was found to be intermediate risk, we would need to evaluate the score as well as other risk factors and determine if an abbreviated chemotherapy may be of benefit. FU with Dr Lindi Adie today at 3:30 pm  Lymphedema issues, if any:  no  Pain issues, if any:   no  SAFETY ISSUES:  Prior radiation? no  Pacemaker/ICD? no  Possible current pregnancy? no  Is the patient on methotrexate? no  Current Complaints / other details:  Married, 2 children, works as Location manager for [employer].  She is here with her husband.  menarche age of 66,  P 2, first birth age 12, BCP x 39 yrs, no HRT  She has no family history of Breast/GYN/GI cancer    Andria Rhein, RN 08/14/2014,9:44 AM

## 2014-08-14 NOTE — Progress Notes (Signed)
Please see the Nurse Progress Note in the MD Initial Consult Encounter for this patient. 

## 2014-08-14 NOTE — Assessment & Plan Note (Signed)
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 I discussed with her the risks and benefits of antiestrogen therapy.  Recommendation: Tamoxifen 20 mg daily  Tamoxifen counseling:We discussed the risks and benefits of tamoxifen. These include but not limited to insomnia, hot flashes, mood changes, vaginal dryness, and weight gain. Although rare, serious side effects including endometrial cancer, risk of blood clots were also discussed. We strongly believe that the benefits far outweigh the risks. Patient understands these risks and consented to starting treatment. Planned treatment duration is 5-10 years.  Patient to return back to see as one month after starting tamoxifen. She will be starting radiation therapy probably in January 2016 with Dr. Valere Dross and she has an appointment to see him today.

## 2014-08-15 ENCOUNTER — Telehealth: Payer: Self-pay | Admitting: Hematology and Oncology

## 2014-08-15 NOTE — Telephone Encounter (Signed)
, °

## 2014-08-22 ENCOUNTER — Encounter: Payer: Self-pay | Admitting: *Deleted

## 2014-08-22 NOTE — Progress Notes (Signed)
[place] Psychosocial Distress Screening Clinical Social Work  Clinical Social Work was referred by distress screening protocol.  The patient scored a 6 on the Psychosocial Distress Thermometer which indicates moderate distress. Clinical Social Worker phoned pt to assess for distress and other psychosocial needs. Pt declined any needs currently, states the things that she has concerns about are not ones CSW can assist. CSW explained role of CSW and Support Tea, but pt not interested in assistance currently.   ON[facility] DISTRESS SCREENING 08/14/2014  Screening Type Initial Screening  Distress experienced in past week (1-10) 6  Family Problem type Other (comment)  Other holidays - settling father-in-law's estate    Clinical Social Worker follow up needed: No.  If yes, follow up plan:  Loren Racer, [place]  [place] Phone: [phone] Fax: [phone]

## 2014-09-02 ENCOUNTER — Ambulatory Visit
Admission: RE | Admit: 2014-09-02 | Discharge: 2014-09-02 | Disposition: A | Discharge status: Home / Self Care | Payer: BLUE CROSS/BLUE SHIELD | Source: Ambulatory Visit | Attending: Radiation Oncology | Admitting: Radiation Oncology

## 2014-09-02 DIAGNOSIS — C50212 Malignant neoplasm of upper-inner quadrant of left female breast: Secondary | ICD-10-CM

## 2014-09-02 DIAGNOSIS — Z510 Encounter for antineoplastic radiation therapy: Secondary | ICD-10-CM | POA: Diagnosis not present

## 2014-09-02 NOTE — Progress Notes (Signed)
Complex simulation/treatment planning note: The patient was taken to the CT simulator.  A Vac lock immobilization device was constructed on a custom breast board.  Her left breast field borders were marked with radiopaque wires along with her partial mastectomy scar.  She was initially scanned free breathing.  The cardiac silhouette was within the tangential fields.  She was rescanned with deep inspiration breath-hold and there was a favorable movement of the heart away from the chest wall/tangential fields.  The CT data set was sent to the planning system.  I contoured her heart and tumor bed.  She was set up to medial and lateral left breast tangents.  2 sets of multileaf collimators were designed to conform the field.  I am prescribing 4250 cGy 17 sessions utilizing 6 MV photons.  No boost.

## 2014-09-03 ENCOUNTER — Encounter: Payer: Self-pay | Admitting: Radiation Oncology

## 2014-09-03 DIAGNOSIS — Z510 Encounter for antineoplastic radiation therapy: Secondary | ICD-10-CM | POA: Diagnosis not present

## 2014-09-03 NOTE — Progress Notes (Signed)
3-D simulation note: The patient completed 3-D simulation for treatment to her left breast.  She was set up to tangential fields.  Four unique complex treatment devices including multileaf collimators and electronic compensation were utilized, 2 for each tangent.  Dose volume histograms were obtained for the target structures including breast/tumor bed in addition to avoidance structures including the heart and lungs.  We met our departmental guidelines.  I'm prescribing 4250 cGy in 17 sessions utilizing 6 MV photons with deep inspiration breath-hold.  No boost.

## 2014-09-09 ENCOUNTER — Ambulatory Visit
Admission: RE | Admit: 2014-09-09 | Discharge: 2014-09-09 | Disposition: A | Discharge status: Home / Self Care | Payer: BLUE CROSS/BLUE SHIELD | Source: Ambulatory Visit | Attending: Radiation Oncology | Admitting: Radiation Oncology

## 2014-09-09 DIAGNOSIS — Z510 Encounter for antineoplastic radiation therapy: Secondary | ICD-10-CM | POA: Diagnosis not present

## 2014-09-09 DIAGNOSIS — C50212 Malignant neoplasm of upper-inner quadrant of left female breast: Secondary | ICD-10-CM

## 2014-09-09 NOTE — Progress Notes (Signed)
Simulation verification note: The patient underwent simulation verification for treatment to her left breast.  Her isocenter was in good position and the multileaf collimators contoured the treatment volume appropriately.

## 2014-09-10 ENCOUNTER — Ambulatory Visit
Admission: RE | Admit: 2014-09-10 | Discharge: 2014-09-10 | Disposition: A | Discharge status: Home / Self Care | Payer: BLUE CROSS/BLUE SHIELD | Source: Ambulatory Visit | Attending: Radiation Oncology | Admitting: Radiation Oncology

## 2014-09-10 DIAGNOSIS — Z510 Encounter for antineoplastic radiation therapy: Secondary | ICD-10-CM | POA: Diagnosis not present

## 2014-09-11 ENCOUNTER — Ambulatory Visit
Admission: RE | Admit: 2014-09-11 | Discharge: 2014-09-11 | Disposition: A | Discharge status: Home / Self Care | Payer: BLUE CROSS/BLUE SHIELD | Source: Ambulatory Visit | Attending: Radiation Oncology | Admitting: Radiation Oncology

## 2014-09-11 DIAGNOSIS — Z510 Encounter for antineoplastic radiation therapy: Secondary | ICD-10-CM | POA: Diagnosis not present

## 2014-09-12 ENCOUNTER — Ambulatory Visit
Admission: RE | Admit: 2014-09-12 | Discharge: 2014-09-12 | Disposition: A | Discharge status: Home / Self Care | Payer: BLUE CROSS/BLUE SHIELD | Source: Ambulatory Visit | Attending: Radiation Oncology | Admitting: Radiation Oncology

## 2014-09-12 DIAGNOSIS — Z510 Encounter for antineoplastic radiation therapy: Secondary | ICD-10-CM | POA: Diagnosis not present

## 2014-09-13 ENCOUNTER — Ambulatory Visit
Admission: RE | Admit: 2014-09-13 | Discharge: 2014-09-13 | Disposition: A | Discharge status: Home / Self Care | Payer: BLUE CROSS/BLUE SHIELD | Source: Ambulatory Visit | Attending: Radiation Oncology | Admitting: Radiation Oncology

## 2014-09-13 DIAGNOSIS — Z510 Encounter for antineoplastic radiation therapy: Secondary | ICD-10-CM | POA: Diagnosis not present

## 2014-09-13 DIAGNOSIS — C50212 Malignant neoplasm of upper-inner quadrant of left female breast: Secondary | ICD-10-CM

## 2014-09-13 MED ORDER — ALRA NON-METALLIC DEODORANT (RAD-ONC)
1.0000 "application " | Freq: Once | TOPICAL | Status: AC
  Administered 2014-09-13: 1 via TOPICAL

## 2014-09-13 MED ORDER — RADIAPLEXRX EX GEL
Freq: Once | CUTANEOUS | Status: AC
  Administered 2014-09-13: 17:00:00 via TOPICAL

## 2014-09-13 NOTE — Progress Notes (Addendum)
Education today regarding management of potential side effects related to radiation therapy to the left breast inclusive of pain, tanning/redness, dryness, moist/dry peeling of skin, and fatigue. Given Radiaplex Gel with instructions to apply at least twice daily with the caveat to ensure that the first application of the day is applied 4 hours prior to treatment.  Given Alra deodorant with instructions to apply a test patch 4 days in a row to ensure tolerance.  Given the Radiation Therapy and You booklet with the appropriate pages marked(inclusive of diet).  Given this RN's Business card and instructed on how to obtain assistance after departmental hours daily and on the weekend.  Accompanied by her spouse.  Teach Back.

## 2014-09-13 NOTE — Addendum Note (Signed)
Encounter addended by: Deirdre Evener, RN on: 09/13/2014  4:43 PM<BR>     Documentation filed: Inpatient Patient Education, Dx Association, Inpatient MAR, Orders

## 2014-09-13 NOTE — Addendum Note (Signed)
Encounter addended by: Deirdre Evener, RN on: 09/13/2014  4:50 PM<BR>     Documentation filed: Inpatient Patient Education

## 2014-09-16 ENCOUNTER — Ambulatory Visit
Admission: RE | Admit: 2014-09-16 | Discharge: 2014-09-16 | Disposition: A | Discharge status: Home / Self Care | Payer: BLUE CROSS/BLUE SHIELD | Source: Ambulatory Visit | Attending: Radiation Oncology | Admitting: Radiation Oncology

## 2014-09-16 ENCOUNTER — Encounter: Payer: Self-pay | Admitting: Radiation Oncology

## 2014-09-16 VITALS — BP 143/88 | HR 73 | Temp 98.1°F | Resp 10 | Wt 242.7 lb

## 2014-09-16 DIAGNOSIS — Z510 Encounter for antineoplastic radiation therapy: Secondary | ICD-10-CM | POA: Diagnosis not present

## 2014-09-16 DIAGNOSIS — C50212 Malignant neoplasm of upper-inner quadrant of left female breast: Secondary | ICD-10-CM

## 2014-09-16 NOTE — Progress Notes (Signed)
Weekly Management Note:  Site: Left breast Current Dose:  1250  cGy Projected Dose: 4250  cGy  Narrative: The patient is seen today for routine under treatment assessment. CBCT/MVCT images/port films were reviewed. The chart was reviewed.   She is without complaints today.  She uses Radioplex gel.  Physical Examination:  Filed Vitals:   09/16/14 1545  BP: 143/88  Pulse: 73  Temp: 98.1 F (36.7 C)  Resp: 10  .  Weight: 242 lb 11.2 oz (110.088 kg).  No significant skin changes.  Impression: Tolerating radiation therapy well.  Plan: Continue radiation therapy as planned.

## 2014-09-16 NOTE — Progress Notes (Signed)
She is currently in no pain.  Pt left breast- erythema, swelling and breast tenderness.  Pt denies edema. Pt reports they aren't using the  Radiaplex, encouraged to do so. BP 143/88 mmHg  Pulse 73  Temp(Src) 98.1 F (36.7 C) (Oral)  Resp 10  Wt 242 lb 11.2 oz (110.088 kg)  SpO2 99%

## 2014-09-17 ENCOUNTER — Ambulatory Visit
Admission: RE | Admit: 2014-09-17 | Discharge: 2014-09-17 | Disposition: A | Discharge status: Home / Self Care | Payer: BLUE CROSS/BLUE SHIELD | Source: Ambulatory Visit | Attending: Radiation Oncology | Admitting: Radiation Oncology

## 2014-09-17 DIAGNOSIS — Z510 Encounter for antineoplastic radiation therapy: Secondary | ICD-10-CM | POA: Diagnosis not present

## 2014-09-18 ENCOUNTER — Ambulatory Visit
Admission: RE | Admit: 2014-09-18 | Discharge: 2014-09-18 | Disposition: A | Discharge status: Home / Self Care | Payer: BLUE CROSS/BLUE SHIELD | Source: Ambulatory Visit | Attending: Radiation Oncology | Admitting: Radiation Oncology

## 2014-09-18 DIAGNOSIS — Z510 Encounter for antineoplastic radiation therapy: Secondary | ICD-10-CM | POA: Diagnosis not present

## 2014-09-19 ENCOUNTER — Ambulatory Visit
Admission: RE | Admit: 2014-09-19 | Discharge: 2014-09-19 | Disposition: A | Discharge status: Home / Self Care | Payer: BLUE CROSS/BLUE SHIELD | Source: Ambulatory Visit | Attending: Radiation Oncology | Admitting: Radiation Oncology

## 2014-09-19 DIAGNOSIS — Z510 Encounter for antineoplastic radiation therapy: Secondary | ICD-10-CM | POA: Diagnosis not present

## 2014-09-20 ENCOUNTER — Ambulatory Visit: Payer: BLUE CROSS/BLUE SHIELD

## 2014-09-23 ENCOUNTER — Ambulatory Visit
Admission: RE | Admit: 2014-09-23 | Payer: BLUE CROSS/BLUE SHIELD | Source: Ambulatory Visit | Admitting: Radiation Oncology

## 2014-09-23 ENCOUNTER — Ambulatory Visit: Payer: BLUE CROSS/BLUE SHIELD

## 2014-09-24 ENCOUNTER — Ambulatory Visit
Admission: RE | Admit: 2014-09-24 | Discharge: 2014-09-24 | Disposition: A | Discharge status: Home / Self Care | Payer: BLUE CROSS/BLUE SHIELD | Source: Ambulatory Visit | Attending: Radiation Oncology | Admitting: Radiation Oncology

## 2014-09-24 ENCOUNTER — Encounter: Payer: Self-pay | Admitting: Radiation Oncology

## 2014-09-24 VITALS — BP 140/62 | HR 80 | Temp 97.5°F | Resp 20 | Wt 243.1 lb

## 2014-09-24 DIAGNOSIS — Z510 Encounter for antineoplastic radiation therapy: Secondary | ICD-10-CM | POA: Diagnosis not present

## 2014-09-24 DIAGNOSIS — C50212 Malignant neoplasm of upper-inner quadrant of left female breast: Secondary | ICD-10-CM

## 2014-09-24 NOTE — Progress Notes (Signed)
Weekly Management Note:  Site: Left breast  Current Dose:  2250  cGy Projected Dose: 4250  cGy  Narrative: The patient is seen today for routine under treatment assessment. CBCT/MVCT images/port films were reviewed. The chart was reviewed.   She is without complaints today.  She uses Radioplex gel.  We missed treatments his past Friday and Monday because of weather and machine downtime.  Physical Examination:  Filed Vitals:   09/24/14 1547  BP: 140/62  Pulse: 80  Temp: 97.5 F (36.4 C)  Resp: 20  .  Weight: 243 lb 1.6 oz (110.269 kg).  There is mild erythema along the left breast as expected.  No areas of desquamation.  Impression: Tolerating radiation therapy well.  Plan: Continue radiation therapy as planned.

## 2014-09-24 NOTE — Progress Notes (Signed)
Weekly rad txs, left breast, erythema mild, skin intact, mild tenderness, , radiaplex bid, no pain, , appetite good 3:50 PM

## 2014-09-25 ENCOUNTER — Ambulatory Visit
Admission: RE | Admit: 2014-09-25 | Discharge: 2014-09-25 | Disposition: A | Discharge status: Home / Self Care | Payer: BLUE CROSS/BLUE SHIELD | Source: Ambulatory Visit | Attending: Radiation Oncology | Admitting: Radiation Oncology

## 2014-09-25 DIAGNOSIS — Z510 Encounter for antineoplastic radiation therapy: Secondary | ICD-10-CM | POA: Diagnosis not present

## 2014-09-26 ENCOUNTER — Ambulatory Visit
Admission: RE | Admit: 2014-09-26 | Discharge: 2014-09-26 | Disposition: A | Discharge status: Home / Self Care | Payer: BLUE CROSS/BLUE SHIELD | Source: Ambulatory Visit | Attending: Radiation Oncology | Admitting: Radiation Oncology

## 2014-09-26 DIAGNOSIS — Z510 Encounter for antineoplastic radiation therapy: Secondary | ICD-10-CM | POA: Diagnosis not present

## 2014-09-27 ENCOUNTER — Ambulatory Visit
Admission: RE | Admit: 2014-09-27 | Discharge: 2014-09-27 | Disposition: A | Discharge status: Home / Self Care | Payer: BLUE CROSS/BLUE SHIELD | Source: Ambulatory Visit | Attending: Radiation Oncology | Admitting: Radiation Oncology

## 2014-09-27 DIAGNOSIS — Z510 Encounter for antineoplastic radiation therapy: Secondary | ICD-10-CM | POA: Diagnosis not present

## 2014-09-30 ENCOUNTER — Ambulatory Visit
Admission: RE | Admit: 2014-09-30 | Discharge: 2014-09-30 | Disposition: A | Discharge status: Home / Self Care | Payer: BLUE CROSS/BLUE SHIELD | Source: Ambulatory Visit | Attending: Radiation Oncology | Admitting: Radiation Oncology

## 2014-09-30 ENCOUNTER — Encounter: Payer: Self-pay | Admitting: Radiation Oncology

## 2014-09-30 VITALS — BP 146/83 | HR 83 | Temp 98.6°F | Ht 66.0 in | Wt 266.1 lb

## 2014-09-30 DIAGNOSIS — Z510 Encounter for antineoplastic radiation therapy: Secondary | ICD-10-CM | POA: Diagnosis not present

## 2014-09-30 DIAGNOSIS — C50212 Malignant neoplasm of upper-inner quadrant of left female breast: Secondary | ICD-10-CM | POA: Diagnosis not present

## 2014-09-30 DIAGNOSIS — L598 Other specified disorders of the skin and subcutaneous tissue related to radiation: Secondary | ICD-10-CM | POA: Diagnosis not present

## 2014-09-30 MED ORDER — BIAFINE EX EMUL
Freq: Two times a day (BID) | CUTANEOUS | Status: DC
  Administered 2014-09-30: 17:00:00 via TOPICAL

## 2014-09-30 NOTE — Progress Notes (Addendum)
Weekly Management Note:  Site: Left breast Current Dose:  3250  cGy Projected Dose: 4250  cGy  Narrative: The patient is seen today for routine under treatment assessment. CBCT/MVCT images/port films were reviewed. The chart was reviewed.   She is showing doing well although she does have some discomfort along the inframammary fold.  Physical Examination:  Filed Vitals:   09/30/14 1624  BP: 146/83  Pulse: 83  Temp: 98.6 F (37 C)  .  Weight: 266 lb 1.6 oz (120.702 kg).  There is moderate erythema the skin with dry desquamation along the inframammary fold.  Impression: Tolerating radiation therapy well, although she may develop a moist desquamation along the inframammary fold.  I will check her skin this Thursday before she finishes on Friday.  We will start her on Biafine cream.  I told that if she develops a moist desquamation over the weekend she will need to start antibiotic ointment.   Plan: Continue radiation therapy as planned.

## 2014-09-30 NOTE — Progress Notes (Signed)
Mary Eaton has received 13 fractions to her left breast.  Note erythema and rash-like appearance in the upper inner, portion of her breast and in the inframmary fold.  Reports level 4/10 tenderness of her breast.  Skin intact

## 2014-10-01 ENCOUNTER — Ambulatory Visit
Admission: RE | Admit: 2014-10-01 | Discharge: 2014-10-01 | Disposition: A | Discharge status: Home / Self Care | Payer: BLUE CROSS/BLUE SHIELD | Source: Ambulatory Visit | Attending: Radiation Oncology | Admitting: Radiation Oncology

## 2014-10-01 DIAGNOSIS — Z510 Encounter for antineoplastic radiation therapy: Secondary | ICD-10-CM | POA: Diagnosis not present

## 2014-10-02 ENCOUNTER — Ambulatory Visit: Payer: BLUE CROSS/BLUE SHIELD

## 2014-10-02 ENCOUNTER — Ambulatory Visit
Admission: RE | Admit: 2014-10-02 | Discharge: 2014-10-02 | Disposition: A | Discharge status: Home / Self Care | Payer: BLUE CROSS/BLUE SHIELD | Source: Ambulatory Visit | Attending: Radiation Oncology | Admitting: Radiation Oncology

## 2014-10-02 DIAGNOSIS — Z510 Encounter for antineoplastic radiation therapy: Secondary | ICD-10-CM | POA: Diagnosis not present

## 2014-10-03 ENCOUNTER — Ambulatory Visit: Payer: BLUE CROSS/BLUE SHIELD

## 2014-10-03 ENCOUNTER — Encounter: Payer: Self-pay | Admitting: Radiation Oncology

## 2014-10-03 ENCOUNTER — Ambulatory Visit
Admission: RE | Admit: 2014-10-03 | Discharge: 2014-10-03 | Disposition: A | Discharge status: Home / Self Care | Payer: BLUE CROSS/BLUE SHIELD | Source: Ambulatory Visit | Attending: Radiation Oncology | Admitting: Radiation Oncology

## 2014-10-03 DIAGNOSIS — Z510 Encounter for antineoplastic radiation therapy: Secondary | ICD-10-CM | POA: Diagnosis not present

## 2014-10-03 NOTE — Progress Notes (Signed)
Weekly Management Note:  Site: Left breast Current Dose:  4000  cGy Projected Dose: 4250  cGy  Narrative: The patient is seen today for routine under treatment assessment. CBCT/MVCT images/port films were reviewed. The chart was reviewed.   She is without new complaints today.  She seen today for a skin check.  Physical Examination: There were no vitals filed for this visit..  Weight:  .  There is dry desquamation along the left inframammary region but no moist desquamation.  Impression: Tolerating radiation therapy well.  I again told her that if she develops a moist desquamation along the inframammary region she should apply antibiotic ointment.  Plan: Continue radiation therapy as planned.  She will finish her radiation therapy tomorrow and return to see me for a one-month follow-up visit.

## 2014-10-04 ENCOUNTER — Ambulatory Visit
Admission: RE | Admit: 2014-10-04 | Discharge: 2014-10-04 | Disposition: A | Discharge status: Home / Self Care | Payer: BLUE CROSS/BLUE SHIELD | Source: Ambulatory Visit | Attending: Radiation Oncology | Admitting: Radiation Oncology

## 2014-10-04 ENCOUNTER — Ambulatory Visit: Payer: BLUE CROSS/BLUE SHIELD

## 2014-10-04 DIAGNOSIS — Z510 Encounter for antineoplastic radiation therapy: Secondary | ICD-10-CM | POA: Diagnosis not present

## 2014-10-07 ENCOUNTER — Encounter: Payer: Self-pay | Admitting: Radiation Oncology

## 2014-10-07 NOTE — Progress Notes (Signed)
[facility] End of Treatment Note  Name:Mary Eaton  Date: 10/07/2014 KPT:[id number] DOB:Jan 16, 1966   Status:outpatient    CC: Jasmine December, NP  Dr. Fanny Skates, Dr. Nicholas Lose   REFERRING PHYSICIAN: Dr. Fanny Skates     DIAGNOSIS: Stage IA (T1 N0 M0) invasive ductal/DCIS of the left breast  INDICATION FOR TREATMENT: Curative   TREATMENT DATES: 09/10/2014 through 10/04/2014                           SITE/DOSE:  Left breast 4250 cGy in 17 sessions, hypofractionated (no boost)                          BEAMS/ENERGY: Tangential fields with 6 MV photons, deep inspiration breath-hold                  NARRATIVE:  [name] tolerated treatment well although she did have the expected degree of radiation dermatitis, particularly along the inframammary region with dry desquamation by completion of therapy.                        She used Radioplex gel.  PLAN: Routine followup in one month. Patient instructed to call if questions or worsening complaints in interim.

## 2014-10-11 ENCOUNTER — Encounter: Payer: Self-pay | Admitting: Radiation Oncology

## 2014-10-11 ENCOUNTER — Ambulatory Visit
Admission: RE | Admit: 2014-10-11 | Discharge: 2014-10-11 | Disposition: A | Discharge status: Home / Self Care | Payer: BLUE CROSS/BLUE SHIELD | Source: Ambulatory Visit | Attending: Radiation Oncology | Admitting: Radiation Oncology

## 2014-10-11 VITALS — BP 153/91 | HR 78 | Temp 97.8°F | Ht 66.0 in | Wt 241.2 lb

## 2014-10-11 DIAGNOSIS — C50212 Malignant neoplasm of upper-inner quadrant of left female breast: Secondary | ICD-10-CM

## 2014-10-11 MED ORDER — HYDROCODONE-ACETAMINOPHEN 5-325 MG PO TABS: 1.0000 | ORAL_TABLET | Freq: Four times a day (QID) | ORAL | Status: DC | PRN

## 2014-10-11 NOTE — Progress Notes (Signed)
  Radiation Oncology         [phone]) [phone] ________________________________  Name: Mary Eaton MRN: [id number]  Date: 10/11/2014  DOB: 1966/07/14  Follow-Up Visit Note  Outpatient  CC: Jasmine December, NP  Fanny Skates, MD  Diagnosis and Prior Radiotherapy:    ICD-9-CM ICD-10-CM   1. 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 Horstman presents today for presentation of moist desquamation in the left inframmary fold.  She reports "a lot of drainage" in this area and grades pain as a level 8/10.  Taking Hydrocodone for pain.    ALLERGIES:  has No Known Allergies.  Meds: Current Outpatient Prescriptions  Medication Sig Dispense Refill  . emollient (BIAFINE) cream Apply 1 application topically 2 (two) times daily.    . hyaluronate sodium (RADIAPLEXRX) GEL Apply 1 application topically 2 (two) times daily.    Marland Kitchen HYDROcodone-acetaminophen (NORCO) 5-325 MG per tablet Take 1-2 tablets by mouth every 6 (six) hours as needed for moderate pain or severe pain. 30 tablet 0  . ibuprofen (ADVIL,MOTRIN) 100 MG tablet Take 100 mg by mouth every 6 (six) hours as needed for fever.    . non-metallic deodorant Jethro Poling) MISC Apply 1 application topically daily as needed.    . norethindrone (JOLIVETTE) 0.35 MG tablet Take 1 tablet by mouth daily.    . tamoxifen (NOLVADEX) 20 MG tablet Take 1 tablet (20 mg total) by mouth daily. (Patient not taking: Reported on 09/30/2014) 90 tablet 3   No current facility-administered medications for this encounter.    Physical Findings: The patient is in no acute distress. Patient is alert and  oriented.  height is 5\' 6"  (1.676 m) and weight is 241 lb 3.2 oz (109.408 kg). Her temperature is 97.8 F (36.6 C). Her blood pressure is 153/91 and her pulse is 78. Marland Kitchen   Left breast - patch of moist desquamation at inframammary fold. No sign of infection. Left breast is otherwise dry, hyperpigmented   Lab Findings: Lab Results  Component Value Date   WBC 8.8 07/03/2014   HGB 13.7 07/03/2014   HCT 41.8 07/03/2014   MCV 87.3 07/03/2014   PLT 247 07/03/2014    Radiographic Findings: No results found.  Impression/Plan:  Instructed to apply Silvadene cream daily to the left inframmary fold. Given instructions by Patric Dykes, RN to wash old application completely off prior to each application.  Silvadene applied prior to leaving without any signs of burning. Applied a Telfa non-stick pad to the inframmary fold and secured with stretch netting.  Also given foam pads to aid in moisture control in the inframammary fold as needed.     Refilled Hydrocodone   F/u with Dr Valere Dross as scheduled _____________________________________   Eppie Gibson, MD

## 2014-10-11 NOTE — Progress Notes (Addendum)
Mary Eaton presents today for presentation of moist desqumation in the left inframmary fold.  She reports "a lot of drainage" in this area and grades pain as a level 8/10. Also note rash-like appearance in the inner portion of the breast  Taking Hydrocodone for pain.    Assessment by Dr. Isidore Moos with orders to apply Silvadene cream daily to the left inframmary fold. Given instructions to wash old application completely off prior to each application.  Silvadene applied prior to leaving without any signs of burning. Applied a Telfa non-stick pad to the inframmary fold and secured with stretch netting.  Also given foam pads to aid in moisture control in the inframmary fold as needed.  Stated understanding of instructions.  Accompanied by her spouse and her daughter.

## 2014-10-14 ENCOUNTER — Telehealth: Payer: Self-pay

## 2014-10-14 NOTE — Telephone Encounter (Signed)
Called pt to confirm pt started tamoxifen.  Pt reports she had a bad skin reaction to the radiation and has not started yet, waiting to resolve that issue prior to starting.  Asked pt to contact us if she has not started by 3/15 so we can reschedule as needed.  Pt agreed and voiced understanding.

## 2014-11-04 ENCOUNTER — Encounter: Payer: Self-pay | Admitting: Radiation Oncology

## 2014-11-05 ENCOUNTER — Ambulatory Visit
Admission: RE | Admit: 2014-11-05 | Discharge: 2014-11-05 | Disposition: A | Discharge status: Home / Self Care | Payer: BLUE CROSS/BLUE SHIELD | Source: Ambulatory Visit | Attending: Radiation Oncology | Admitting: Radiation Oncology

## 2014-11-05 ENCOUNTER — Encounter: Payer: Self-pay | Admitting: Radiation Oncology

## 2014-11-05 VITALS — BP 157/90 | HR 80 | Temp 98.0°F | Resp 20 | Ht 66.0 in | Wt 239.4 lb

## 2014-11-05 DIAGNOSIS — C50212 Malignant neoplasm of upper-inner quadrant of left female breast: Secondary | ICD-10-CM

## 2014-11-05 HISTORY — DX: Personal history of irradiation: Z92.3

## 2014-11-05 NOTE — Progress Notes (Addendum)
Follow up breast rad txs 09/10/14-10/04/14 left breast, healing well, tanning still, occasional twinges in breast, appetite good, no fatigue, no c/o pain, started Tamoxifen 20mg  oral daily  Past 2 weeks ,no c/o hot flashes  2:56 PM

## 2014-11-05 NOTE — Progress Notes (Signed)
CC: Dr. Fanny Skates  Follow-up note:  Mary Eaton returns today approximately 1 month following completion of radiation therapy following conservative surgery in the management of her stage I A (T1 N0 M0) invasive ductal/DCIS of the left breast.  Immediately following her radiation therapy she develop a moderately painful moist desquamation along the left breast inframammary region for which she was placed on antibiotic ointment (Silvadene cream) and also given hydrocodone to take when necessary pain.  She has recovered from her moist desquamation.  She has been on tamoxifen for the past 2 weeks.  She'll see Dr. Lindi Adie in mid April and Dr. Dalbert Batman for a follow-up visit in July.  Filed Vitals:   11/05/14 1454  BP: 157/90  Pulse: 80  Temp: 98 F (36.7 C)  Resp: 20   Head and neck examination: Grossly unremarkable.  Nodes: Without palpable cervical, supraclavicular, or axillary lymphadenopathy.  Breasts: There is residual hyperpigmentation with dry desquamation of the skin along the left breast.  There has been complete reepithelialization of her inframammary desquamation.  There is hypopigmentation of the nipple areolar complex as expected.  Extremities: Without edema.  Impression: Satisfactory progress.  She'll see Dr. Lindi Adie in mid April and Dr. Dalbert Batman in July.  She should have her baseline left breast mammogram in October along with a screening right breast mammogram.  This can be scheduled by Dr. Lindi Adie or Dr. Dalbert Batman.  Plan: As above.  I've not scheduled the patient for a formal follow-up visit knowing that she'll be followed long-term by Dr. Lindi Adie.

## 2014-11-19 ENCOUNTER — Telehealth: Payer: Self-pay | Admitting: Hematology and Oncology

## 2014-11-19 NOTE — Telephone Encounter (Signed)
Mailed a letter and a new calendar for April due to dr Lindi Adie out of office   anne

## 2014-12-10 ENCOUNTER — Telehealth: Payer: Self-pay | Admitting: Hematology and Oncology

## 2014-12-10 NOTE — Telephone Encounter (Signed)
Patient request to reschedule her appointment,done

## 2014-12-11 ENCOUNTER — Ambulatory Visit: Payer: BC Managed Care – PPO | Admitting: Hematology and Oncology

## 2014-12-12 ENCOUNTER — Ambulatory Visit: Payer: BLUE CROSS/BLUE SHIELD | Admitting: Hematology and Oncology

## 2014-12-13 ENCOUNTER — Ambulatory Visit: Payer: BC Managed Care – PPO | Admitting: Hematology and Oncology

## 2015-01-06 ENCOUNTER — Ambulatory Visit (HOSPITAL_BASED_OUTPATIENT_CLINIC_OR_DEPARTMENT_OTHER): Payer: BLUE CROSS/BLUE SHIELD | Admitting: Hematology and Oncology

## 2015-01-06 VITALS — BP 146/78 | HR 80 | Temp 98.1°F | Resp 18 | Ht 66.0 in | Wt 232.9 lb

## 2015-01-06 DIAGNOSIS — Z170 Estrogen receptor positive status [ER+]: Secondary | ICD-10-CM

## 2015-01-06 DIAGNOSIS — C50212 Malignant neoplasm of upper-inner quadrant of left female breast: Secondary | ICD-10-CM

## 2015-01-06 NOTE — Assessment & Plan Note (Signed)
Left breast invasive ductal carcinoma grade 1, 1.7 cm, intermediate grade DCIS, 2 SLN negative, ER 100%, PR 100%, HER-2 negative ratio 1.48, Ki-67 18%, T1 cN0 M0 stage IA, Oncotype DX recurrence score 8, 6% rate of recurrence with antiestrogen therapy, low risk  Status post radiation therapy started tamoxifen 20 mg daily  11/04/2014   Tamoxifen toxicities: 1.  Occasional hot flashes 2.  Denies any myalgias or arthralgias   Patient is tolerating tamoxifen extremely well. She will return back to see us every 6 months for surveillance and follow-up and we will start breast exams at that point.   Survivorship: 1.  Patient spends a lot of time outdoors  I instructed  her to have a scheduled exercise  and stay active.  Instructed her to eat more fruits and vegetables. 

## 2015-01-06 NOTE — Progress Notes (Signed)
Patient Care Team: Jasmine December, NP as PCP - General Jasmine December, NP as Nurse Practitioner Nicholas Lose, MD as Consulting Physician (Hematology and Oncology) Fanny Skates, MD as Consulting Physician (General Surgery) Arloa Koh, MD as Consulting Physician (Radiation Oncology)  DIAGNOSIS: Breast cancer of upper-inner quadrant of left female breast   Staging form: Breast, AJCC 7th Edition     Clinical stage from 07/03/2014: Stage IA (T1, N0, M0) - Unsigned       Staging comments: Staged at breast conference on 11.4.15      Pathologic stage from 07/17/2014: Stage IA (T1c, N0, cM0) - Unsigned   SUMMARY OF ONCOLOGIC HISTORY:   Breast cancer of upper-inner quadrant of left female breast   06/20/2014 Initial Diagnosis Left breast biopsy 11:00: IDC with DCIS grade 1, ER 100%, PR percent, Ki-67 80%, HER-2 negative, ratio 1.4 it   07/02/2014 Breast MRI Left breast irregular mass 11:00 1.4 x 1 x 0.8 cm with non-mass enhancement medially and anteriorly extending 4.5 cm, no lymph nodes   07/17/2014 Surgery Left breast lumpectomy: IDC grade 1, 1.7 cm, DCIS intermediate grade, for positive margin she underwent resection of the margin which was negative, to SLN negative, ER 100%, PR 100%, HER-2 negative ratio 1.48, Ki-67 18% T1 cN0 M0 Oncotype score 8 (6% ROR)   09/10/2014 - 10/04/2014 Radiation Therapy Adjuvant XRT   11/04/2014 -  Anti-estrogen oral therapy Tamoxifen 20 mg daily    CHIEF COMPLIANT:  Follow-up on tamoxifen  INTERVAL HISTORY: Mary Eaton is a  49 year old with above-mentioned history of left breast cancer. With lumpectomy and adjuvant radiation and tamoxifen. She is currently on tamoxifen tolerating the change well. She is completely tanned by spending the time of the beach. Her daughter recently graduated. She denies any tamoxifen-related side effects. She does have occasional hot flashes.  REVIEW OF SYSTEMS:   Constitutional: Denies fevers, chills or abnormal weight loss Eyes:  Denies blurriness of vision Ears, nose, mouth, throat, and face: Denies mucositis or sore throat Respiratory: Denies cough, dyspnea or wheezes Cardiovascular: Denies palpitation, chest discomfort or lower extremity swelling Gastrointestinal:  Denies nausea, heartburn or change in bowel habits Skin: Denies abnormal skin rashes Lymphatics: Denies new lymphadenopathy or easy bruising Neurological:Denies numbness, tingling or new weaknesses Behavioral/Psych: Mood is stable, no new changes  Breast:  denies any pain or lumps or nodules in either breasts All other systems were reviewed with the patient and are negative.  I have reviewed the past medical history, past surgical history, social history and family history with the patient and they are unchanged from previous note.  ALLERGIES:  has no allergies on file.  MEDICATIONS:  Current Outpatient Prescriptions  Medication Sig Dispense Refill  . ibuprofen (ADVIL,MOTRIN) 100 MG tablet Take 100 mg by mouth every 6 (six) hours as needed for fever.    . non-metallic deodorant Jethro Poling) MISC Apply 1 application topically daily as needed.    . norethindrone (JOLIVETTE) 0.35 MG tablet Take 1 tablet by mouth daily.    . tamoxifen (NOLVADEX) 20 MG tablet Take 1 tablet (20 mg total) by mouth daily. 90 tablet 3  . HYDROcodone-acetaminophen (NORCO) 5-325 MG per tablet Take 1-2 tablets by mouth every 6 (six) hours as needed for moderate pain or severe pain. (Patient not taking: Reported on 11/05/2014) 30 tablet 0   No current facility-administered medications for this visit.    PHYSICAL EXAMINATION: ECOG PERFORMANCE STATUS: 0 - Asymptomatic  Filed Vitals:   01/06/15 1507  BP: 146/78  Pulse: 80  Temp: 98.1 F (36.7 C)  Resp: 18   Filed Weights   01/06/15 1507  Weight: 232 lb 14.4 oz (105.643 kg)    GENERAL:alert, no distress and comfortable SKIN: skin color, texture, turgor are normal, no rashes or significant lesions EYES: normal, Conjunctiva  are pink and non-injected, sclera clear OROPHARYNX:no exudate, no erythema and lips, buccal mucosa, and tongue normal  NECK: supple, thyroid normal size, non-tender, without nodularity LYMPH:  no palpable lymphadenopathy in the cervical, axillary or inguinal LUNGS: clear to auscultation and percussion with normal breathing effort HEART: regular rate & rhythm and no murmurs and no lower extremity edema ABDOMEN:abdomen soft, non-tender and normal bowel sounds Musculoskeletal:no cyanosis of digits and no clubbing  NEURO: alert & oriented x 3 with fluent speech, no focal motor/sensory deficits  LABORATORY DATA:  I have reviewed the data as listed   Chemistry      Component Value Date/Time   NA 142 07/03/2014 1211   K 3.6 07/03/2014 1211   CO2 30* 07/03/2014 1211   BUN 8.7 07/03/2014 1211   CREATININE 0.9 07/03/2014 1211      Component Value Date/Time   CALCIUM 9.6 07/03/2014 1211   ALKPHOS 79 07/03/2014 1211   AST 38* 07/03/2014 1211   ALT 51 07/03/2014 1211   BILITOT 0.46 07/03/2014 1211       Lab Results  Component Value Date   WBC 8.8 07/03/2014   HGB 13.7 07/03/2014   HCT 41.8 07/03/2014   MCV 87.3 07/03/2014   PLT 247 07/03/2014   NEUTROABS 5.9 07/03/2014    ASSESSMENT & PLAN:  Breast cancer of upper-inner quadrant of left female breast Left breast invasive ductal carcinoma grade 1, 1.7 cm, intermediate grade DCIS, 2 SLN negative, ER 100%, PR 100%, HER-2 negative ratio 1.48, Ki-67 18%, T1 cN0 M0 stage IA, Oncotype DX recurrence score 8, 6% rate of recurrence with antiestrogen therapy, low risk  Status post radiation therapy started tamoxifen 20 mg daily  11/04/2014   Tamoxifen toxicities: 1.  Occasional hot flashes 2.  Denies any myalgias or arthralgias   Patient is tolerating tamoxifen extremely well. She will return back to see [place] every 6 months for surveillance and follow-up and we will start breast exams at that point.   Survivorship: 1.  Patient spends a lot  of time outdoors  I instructed  her to have a scheduled exercise  and stay active.  Instructed her to eat more fruits and vegetables.    No orders of the defined types were placed in this encounter.   The patient has a good understanding of the overall plan. she agrees with it. she will call with any problems that may develop before the next visit here.   Rulon Eisenmenger, MD

## 2015-01-07 ENCOUNTER — Telehealth: Payer: Self-pay | Admitting: Hematology and Oncology

## 2015-01-07 NOTE — Telephone Encounter (Signed)
6 month appointment made and a letter and calendar has been mailed

## 2015-02-13 ENCOUNTER — Ambulatory Visit: Payer: BC Managed Care – PPO | Admitting: Hematology and Oncology

## 2015-07-04 ENCOUNTER — Telehealth: Payer: Self-pay | Admitting: Hematology and Oncology

## 2015-07-04 NOTE — Telephone Encounter (Signed)
pt called to r/s appt to after mammo....done....pt ok and aware of new d.t

## 2015-07-08 ENCOUNTER — Ambulatory Visit: Payer: BLUE CROSS/BLUE SHIELD | Admitting: Hematology and Oncology

## 2015-07-29 ENCOUNTER — Telehealth: Payer: Self-pay | Admitting: Hematology and Oncology

## 2015-07-29 ENCOUNTER — Encounter: Payer: Self-pay | Admitting: Hematology and Oncology

## 2015-07-29 ENCOUNTER — Ambulatory Visit (HOSPITAL_BASED_OUTPATIENT_CLINIC_OR_DEPARTMENT_OTHER): Payer: BLUE CROSS/BLUE SHIELD | Admitting: Hematology and Oncology

## 2015-07-29 VITALS — BP 153/87 | HR 72 | Temp 98.2°F | Resp 18 | Ht 66.0 in | Wt 229.6 lb

## 2015-07-29 DIAGNOSIS — Z170 Estrogen receptor positive status [ER+]: Secondary | ICD-10-CM | POA: Diagnosis not present

## 2015-07-29 DIAGNOSIS — C50212 Malignant neoplasm of upper-inner quadrant of left female breast: Secondary | ICD-10-CM | POA: Diagnosis not present

## 2015-07-29 DIAGNOSIS — Z923 Personal history of irradiation: Secondary | ICD-10-CM | POA: Diagnosis not present

## 2015-07-29 DIAGNOSIS — Z79810 Long term (current) use of selective estrogen receptor modulators (SERMs): Secondary | ICD-10-CM

## 2015-07-29 NOTE — Assessment & Plan Note (Signed)
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 Occasional hot flashes 2. Denies any myalgias or arthralgias  Breast Cancer Surveillance: 1. Breast exam  07/29/2015: Normal 2. Mammogram  Will need to be ordered   Return to clinic in 6 months for follow-up 

## 2015-07-29 NOTE — Addendum Note (Signed)
Addended by: Prentiss Bells on: 07/29/2015 04:57 PM   Modules accepted: Medications

## 2015-07-29 NOTE — Progress Notes (Signed)
Patient Care Team: Jasmine December, NP as PCP - General Jasmine December, NP as Nurse Practitioner Nicholas Lose, MD as Consulting Physician (Hematology and Oncology) Fanny Skates, MD as Consulting Physician (General Surgery) Arloa Koh, MD as Consulting Physician (Radiation Oncology)  DIAGNOSIS: Breast cancer of upper-inner quadrant of left female breast [facility])   Staging form: Breast, AJCC 7th Edition     Clinical stage from 07/03/2014: Stage IA (T1, N0, M0) - Unsigned       Staging comments: Staged at breast conference on 11.4.15      Pathologic stage from 07/17/2014: Stage IA (T1c, N0, cM0) - Unsigned   SUMMARY OF ONCOLOGIC HISTORY:   Breast cancer of upper-inner quadrant of left female breast ([place])   06/20/2014 Initial Diagnosis Left breast biopsy 11:00: IDC with DCIS grade 1, ER 100%, PR percent, Ki-67 80%, HER-2 negative, ratio 1.4 it   07/02/2014 Breast MRI Left breast irregular mass 11:00 1.4 x 1 x 0.8 cm with non-mass enhancement medially and anteriorly extending 4.5 cm, no lymph nodes   07/17/2014 Surgery Left breast lumpectomy: IDC grade 1, 1.7 cm, DCIS intermediate grade, for positive margin she underwent resection of the margin which was negative, to SLN negative, ER 100%, PR 100%, HER-2 negative ratio 1.48, Ki-67 18% T1 cN0 M0 Oncotype score 8 (6% ROR)   09/10/2014 - 10/04/2014 Radiation Therapy Adjuvant XRT   11/04/2014 -  Anti-estrogen oral therapy Tamoxifen 20 mg daily    CHIEF COMPLIANT: follow-up on tamoxifen  INTERVAL HISTORY: Mary Eaton is a 49 year old with above-mentioned history of left breast cancer treated with lumpectomy and adjuvant radiation and is currently on tamoxifen adjuvant therapy. She is tolerating tamoxifen much better. She had a lot of hot flashes myalgias initially. These have slowly improved over time. She denies any lumps or nodules in the breasts.  REVIEW OF SYSTEMS:   Constitutional: Denies fevers, chills or abnormal weight loss Eyes: Denies  blurriness of vision Ears, nose, mouth, throat, and face: Denies mucositis or sore throat Respiratory: Denies cough, dyspnea or wheezes Cardiovascular: Denies palpitation, chest discomfort or lower extremity swelling Gastrointestinal:  Denies nausea, heartburn or change in bowel habits Skin: Denies abnormal skin rashes Lymphatics: Denies new lymphadenopathy or easy bruising Neurological:Denies numbness, tingling or new weaknesses Behavioral/Psych: Mood is stable, no new changes  Breast:  denies any pain or lumps or nodules in either breasts All other systems were reviewed with the patient and are negative.  I have reviewed the past medical history, past surgical history, social history and family history with the patient and they are unchanged from previous note.  ALLERGIES:  has no allergies on file.  MEDICATIONS:  Current Outpatient Prescriptions  Medication Sig Dispense Refill  . HYDROcodone-acetaminophen (NORCO) 5-325 MG per tablet Take 1-2 tablets by mouth every 6 (six) hours as needed for moderate pain or severe pain. (Patient not taking: Reported on 11/05/2014) 30 tablet 0  . ibuprofen (ADVIL,MOTRIN) 100 MG tablet Take 100 mg by mouth every 6 (six) hours as needed for fever.    . non-metallic deodorant Jethro Poling) MISC Apply 1 application topically daily as needed.    . norethindrone (JOLIVETTE) 0.35 MG tablet Take 1 tablet by mouth daily.    . tamoxifen (NOLVADEX) 20 MG tablet Take 1 tablet (20 mg total) by mouth daily. 90 tablet 3   No current facility-administered medications for this visit.    PHYSICAL EXAMINATION: ECOG PERFORMANCE STATUS: 1 - Symptomatic but completely ambulatory  Filed Vitals:   07/29/15 1443  BP:  153/87  Pulse: 72  Temp: 98.2 F (36.8 C)  Resp: 18   Filed Weights   07/29/15 1443  Weight: 229 lb 9.6 oz (104.146 kg)    GENERAL:alert, no distress and comfortable SKIN: skin color, texture, turgor are normal, no rashes or significant lesions EYES:  normal, Conjunctiva are pink and non-injected, sclera clear OROPHARYNX:no exudate, no erythema and lips, buccal mucosa, and tongue normal  NECK: supple, thyroid normal size, non-tender, without nodularity LYMPH:  no palpable lymphadenopathy in the cervical, axillary or inguinal LUNGS: clear to auscultation and percussion with normal breathing effort HEART: regular rate & rhythm and no murmurs and no lower extremity edema ABDOMEN:abdomen soft, non-tender and normal bowel sounds Musculoskeletal:no cyanosis of digits and no clubbing  NEURO: alert & oriented x 3 with fluent speech, no focal motor/sensory deficits BREAST:mild breast lymphedema in the left breast. The surgical scars are well-healed. No palpable lumps or nodules in the right breast or axilla.. (exam performed in the presence of a chaperone)  LABORATORY DATA:  I have reviewed the data as listed   Chemistry      Component Value Date/Time   NA 142 07/03/2014 1211   K 3.6 07/03/2014 1211   CO2 30* 07/03/2014 1211   BUN 8.7 07/03/2014 1211   CREATININE 0.9 07/03/2014 1211      Component Value Date/Time   CALCIUM 9.6 07/03/2014 1211   ALKPHOS 79 07/03/2014 1211   AST 38* 07/03/2014 1211   ALT 51 07/03/2014 1211   BILITOT 0.46 07/03/2014 1211       Lab Results  Component Value Date   WBC 8.8 07/03/2014   HGB 13.7 07/03/2014   HCT 41.8 07/03/2014   MCV 87.3 07/03/2014   PLT 247 07/03/2014   NEUTROABS 5.9 07/03/2014   ASSESSMENT & PLAN:  Breast cancer of upper-inner quadrant of left female breast Left breast invasive ductal carcinoma grade 1, 1.7 cm, intermediate grade DCIS, 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 Occasional hot flashes 2. Denies any myalgias or arthralgias Many  of the hot flashes or myalgias and arthralgias have completely resolved  Breast Cancer Surveillance: 1. Breast exam  07/29/2015: Normal 2. Mammogram 07/22/2015 at [place] normal breast density category B   Return to clinic in 6 months for follow-up   No orders of the defined types were placed in this encounter.   The patient has a good understanding of the overall plan. she agrees with it. she will call with any problems that may develop before the next visit here.   Rulon Eisenmenger, MD 07/29/2015

## 2015-07-29 NOTE — Telephone Encounter (Signed)
Gave patient avs report and appointments for June.  °

## 2015-11-17 IMAGING — MG MM DIGITAL DIAGNOSTIC UNILAT L
2 series · 2 of 2 positions shown · non-contrast
Comparison: Previous exams

CLINICAL DATA: 48-year-old female -evaluate clip placement
following ultrasound-guided left breast biopsy.

EXAM:
DIAGNOSTIC LEFT MAMMOGRAM POST ULTRASOUND BIOPSY

[L ML]
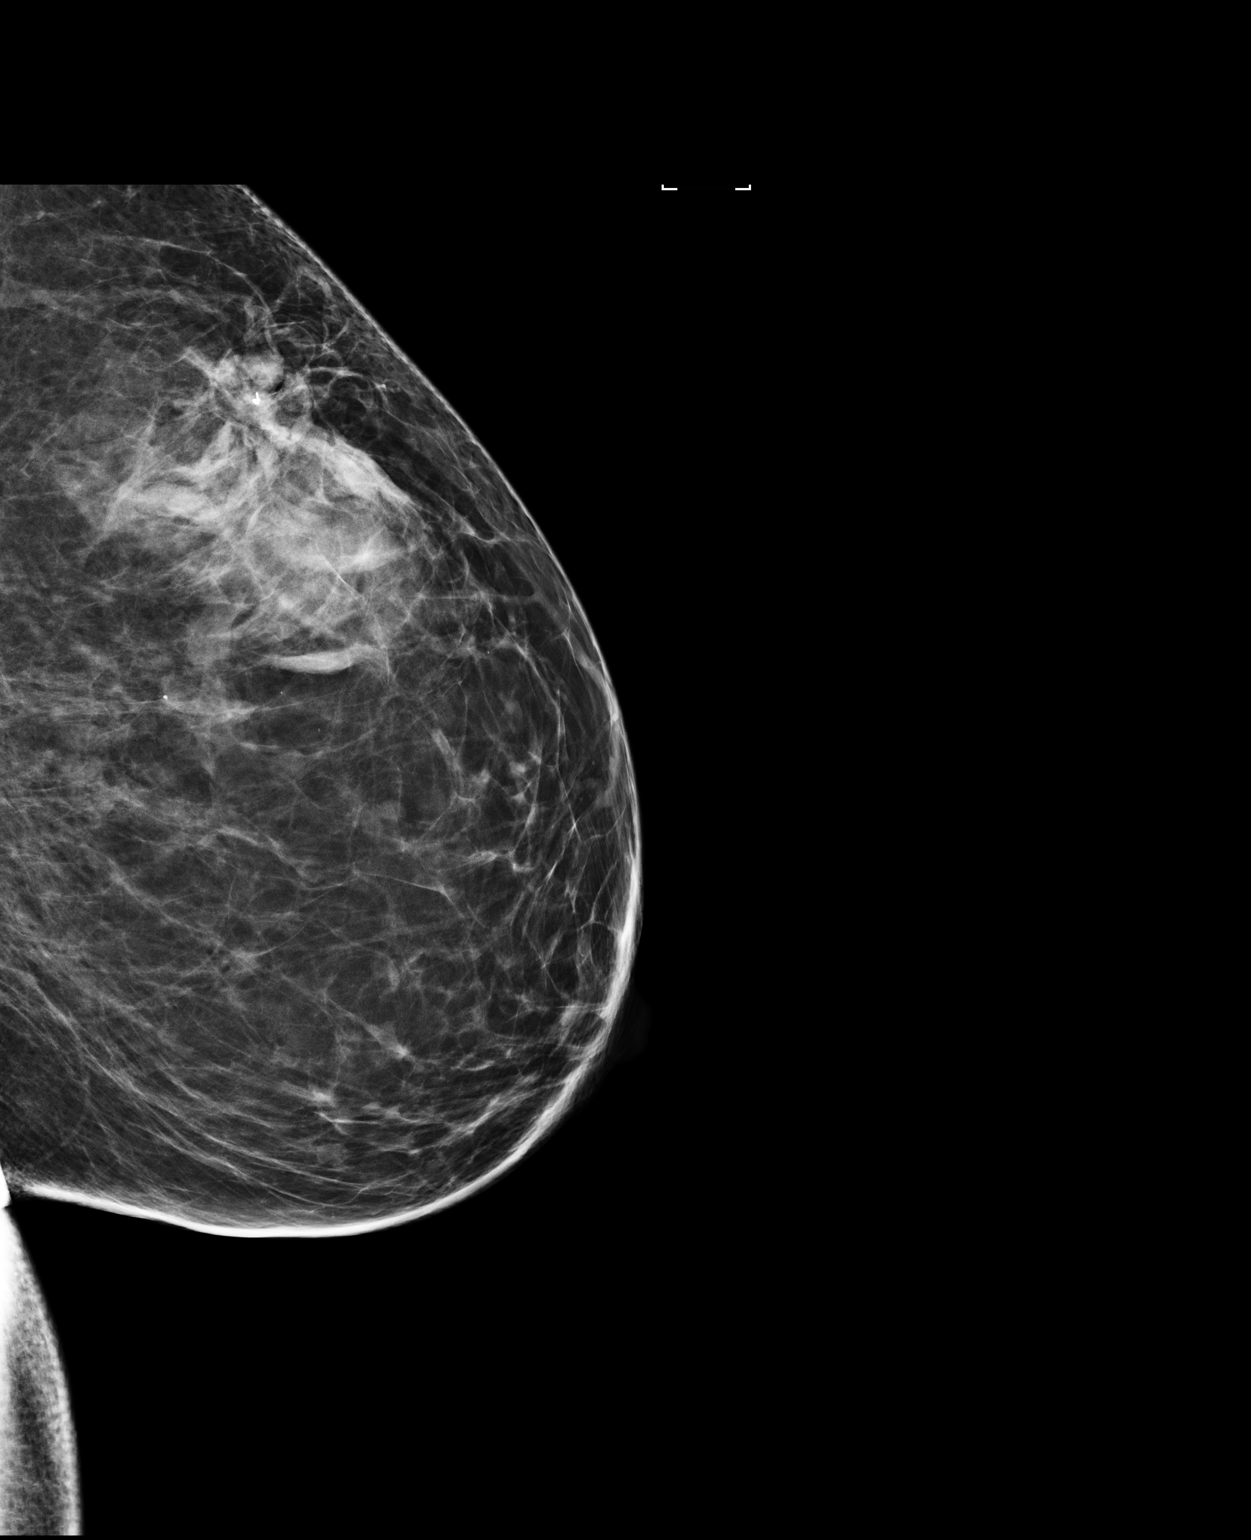

[L CC]
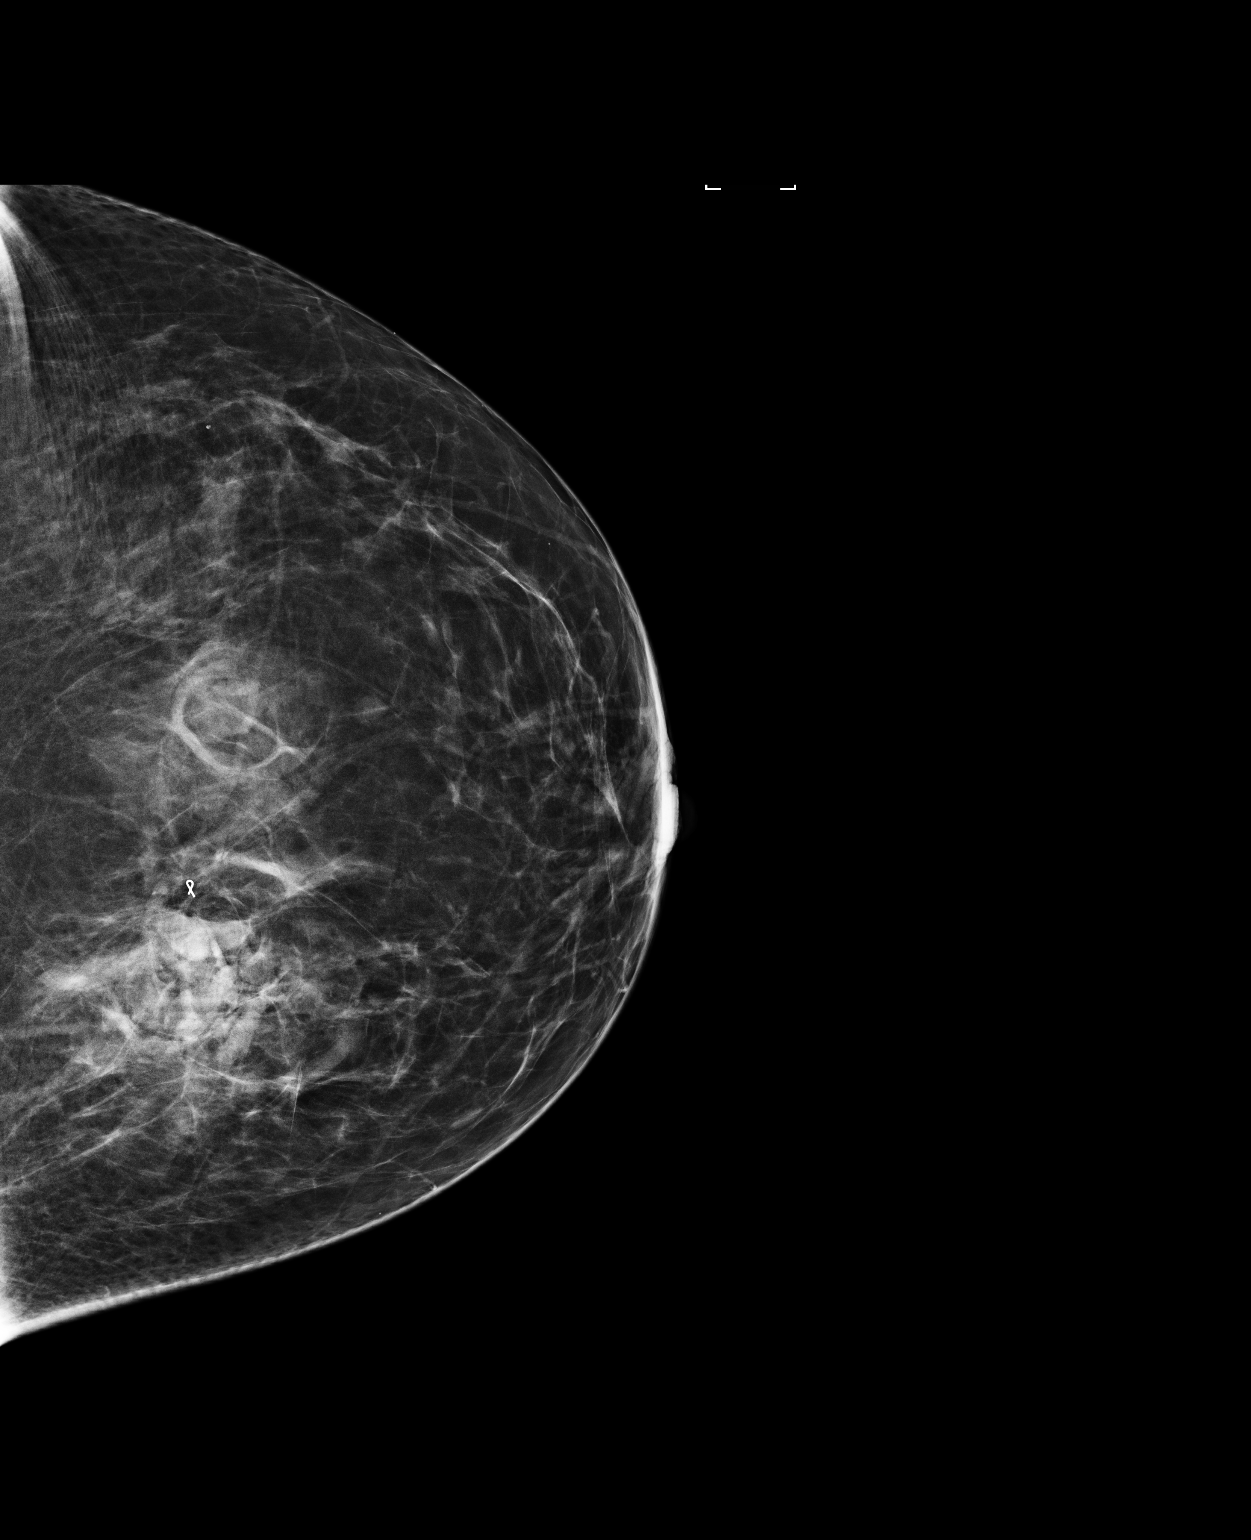

[2 of 2 positions shown; findings below may reference images not displayed]

FINDINGS: Mammographic images were obtained following ultrasound guided biopsy
of the 1.4 cm mass at the 11 o'clock position of the left breast.

A ribbon shaped clip is in satisfactory position.

A mild amount of post biopsy hemorrhage is noted..
IMPRESSION: Satisfactory clip placement following ultrasound-guided left breast
biopsy.

Post biopsy hemorrhage.

Final Assessment: Post Procedure Mammograms for Marker Placement

## 2015-12-12 IMAGING — MG MM LT RADIOACTIVE SEED LOC MAMMO GUIDE
2 series · 2 of 2 positions shown · non-contrast
Comparison: Previous exam(s)

CLINICAL DATA: Biopsy proven left breast invasive ductal carcinoma
and ductal carcinoma in situ.

EXAM:
MAMMOGRAPHIC GUIDED RADIOACTIVE SEED LOCALIZATION OF THE LEFT BREAST

[L ML]
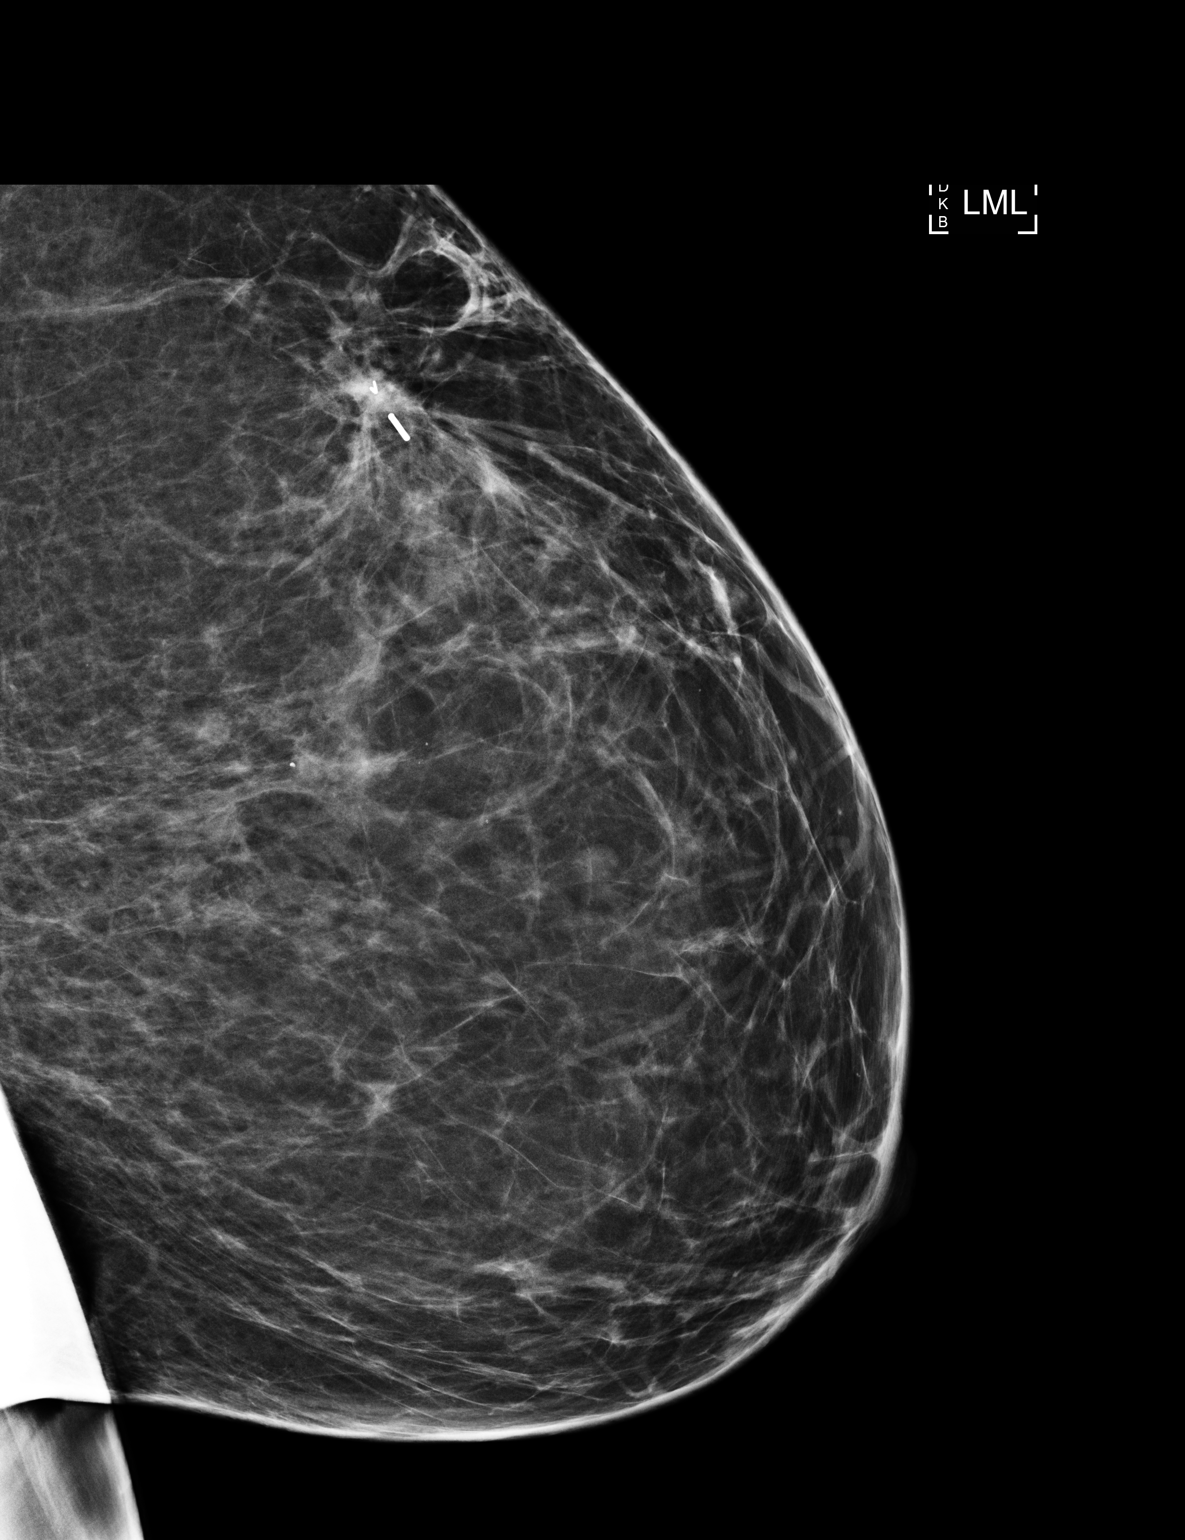

[L CC]
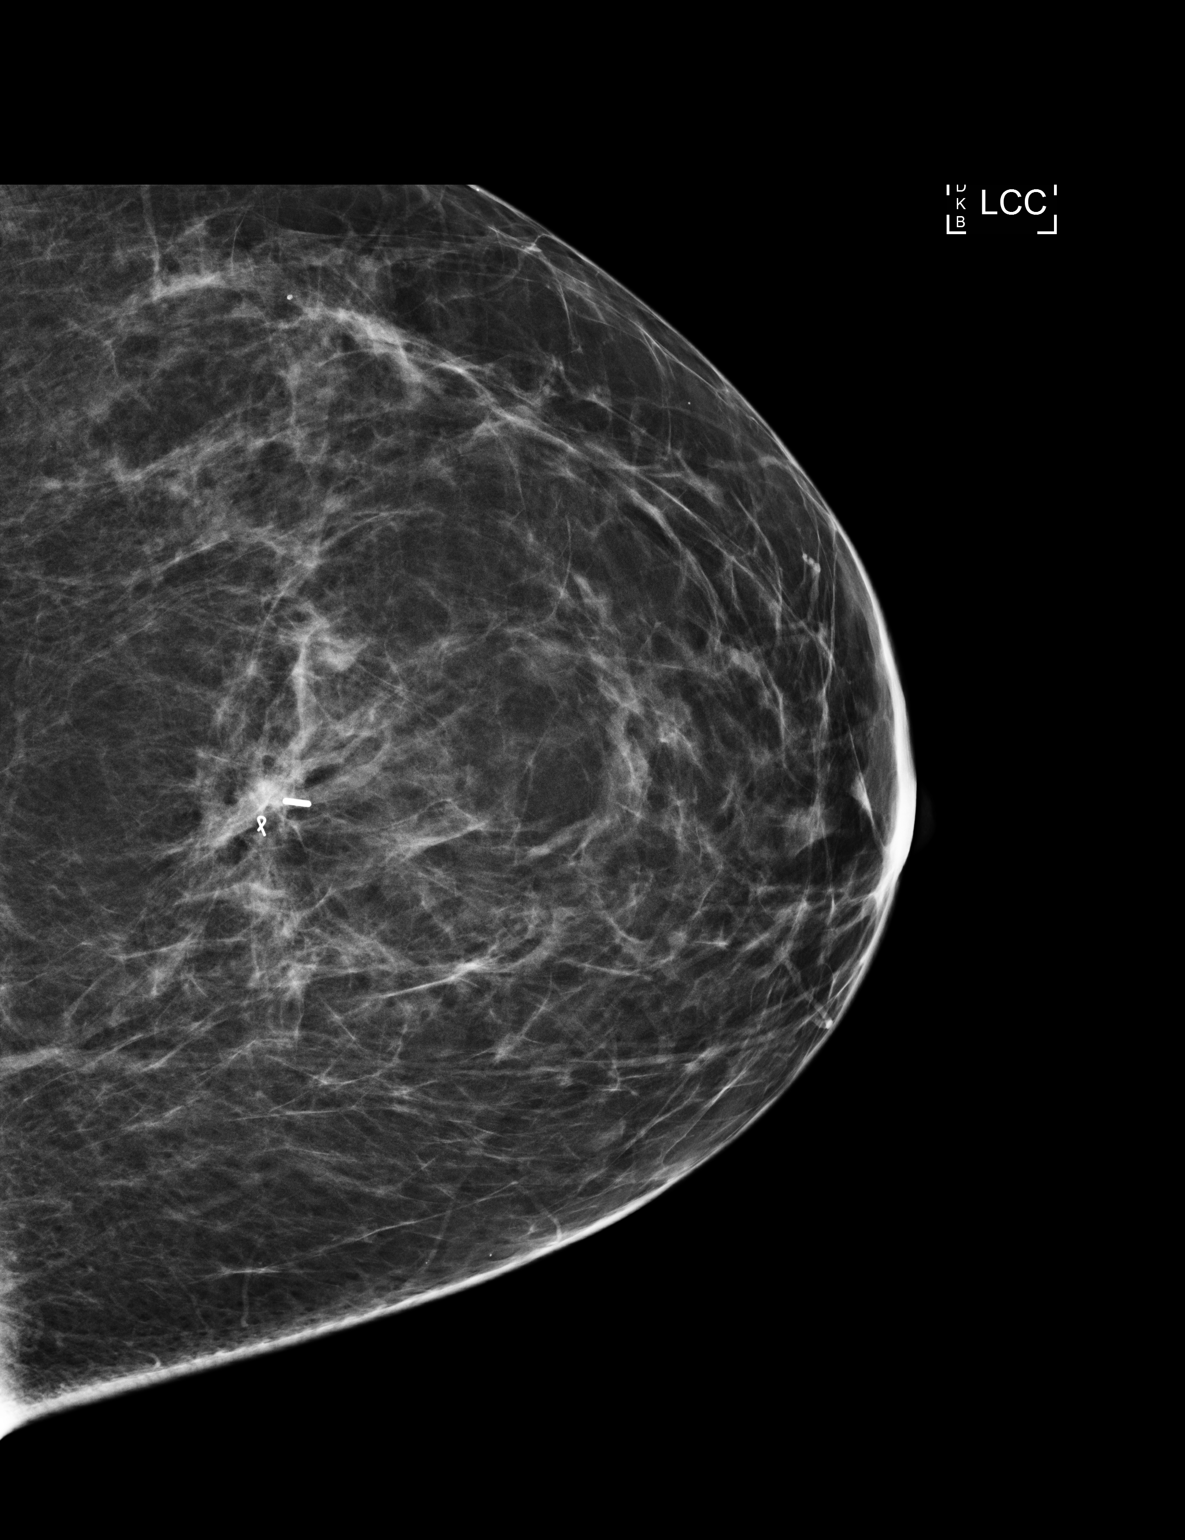

[2 of 2 positions shown; findings below may reference images not displayed]



The usual time-out protocol was performed immediately prior to the
procedure.

Using mammographic guidance, sterile technique, 2% lidocaine and an
9-19C radioactive seed, the mass and clip in the upper-inner
quadrant of the left breast was localized using a superior to
inferior approach. The follow-up mammogram images confirm the seed
in the expected location and are marked for Dr. Sanfora.

Follow-up survey of the patient confirms presence of radioactive
seed.

Order number of [id number] seed:  [id number].

Total activity:  0.246 mCi  Reference Date: 07/03/2014

The patient tolerated the procedure well and was released from the
[REDACTED]. She was given instructions regarding seed removal.
IMPRESSION: Radioactive seed localization the left breast. No apparent
complications.

## 2015-12-12 IMAGING — CR DG CHEST 2V
2 series · 2 of 2 positions shown · non-contrast
Comparison: 08/15/2013.

CLINICAL DATA: Preop.

EXAM:
CHEST  2 VIEW

[w chest pa]
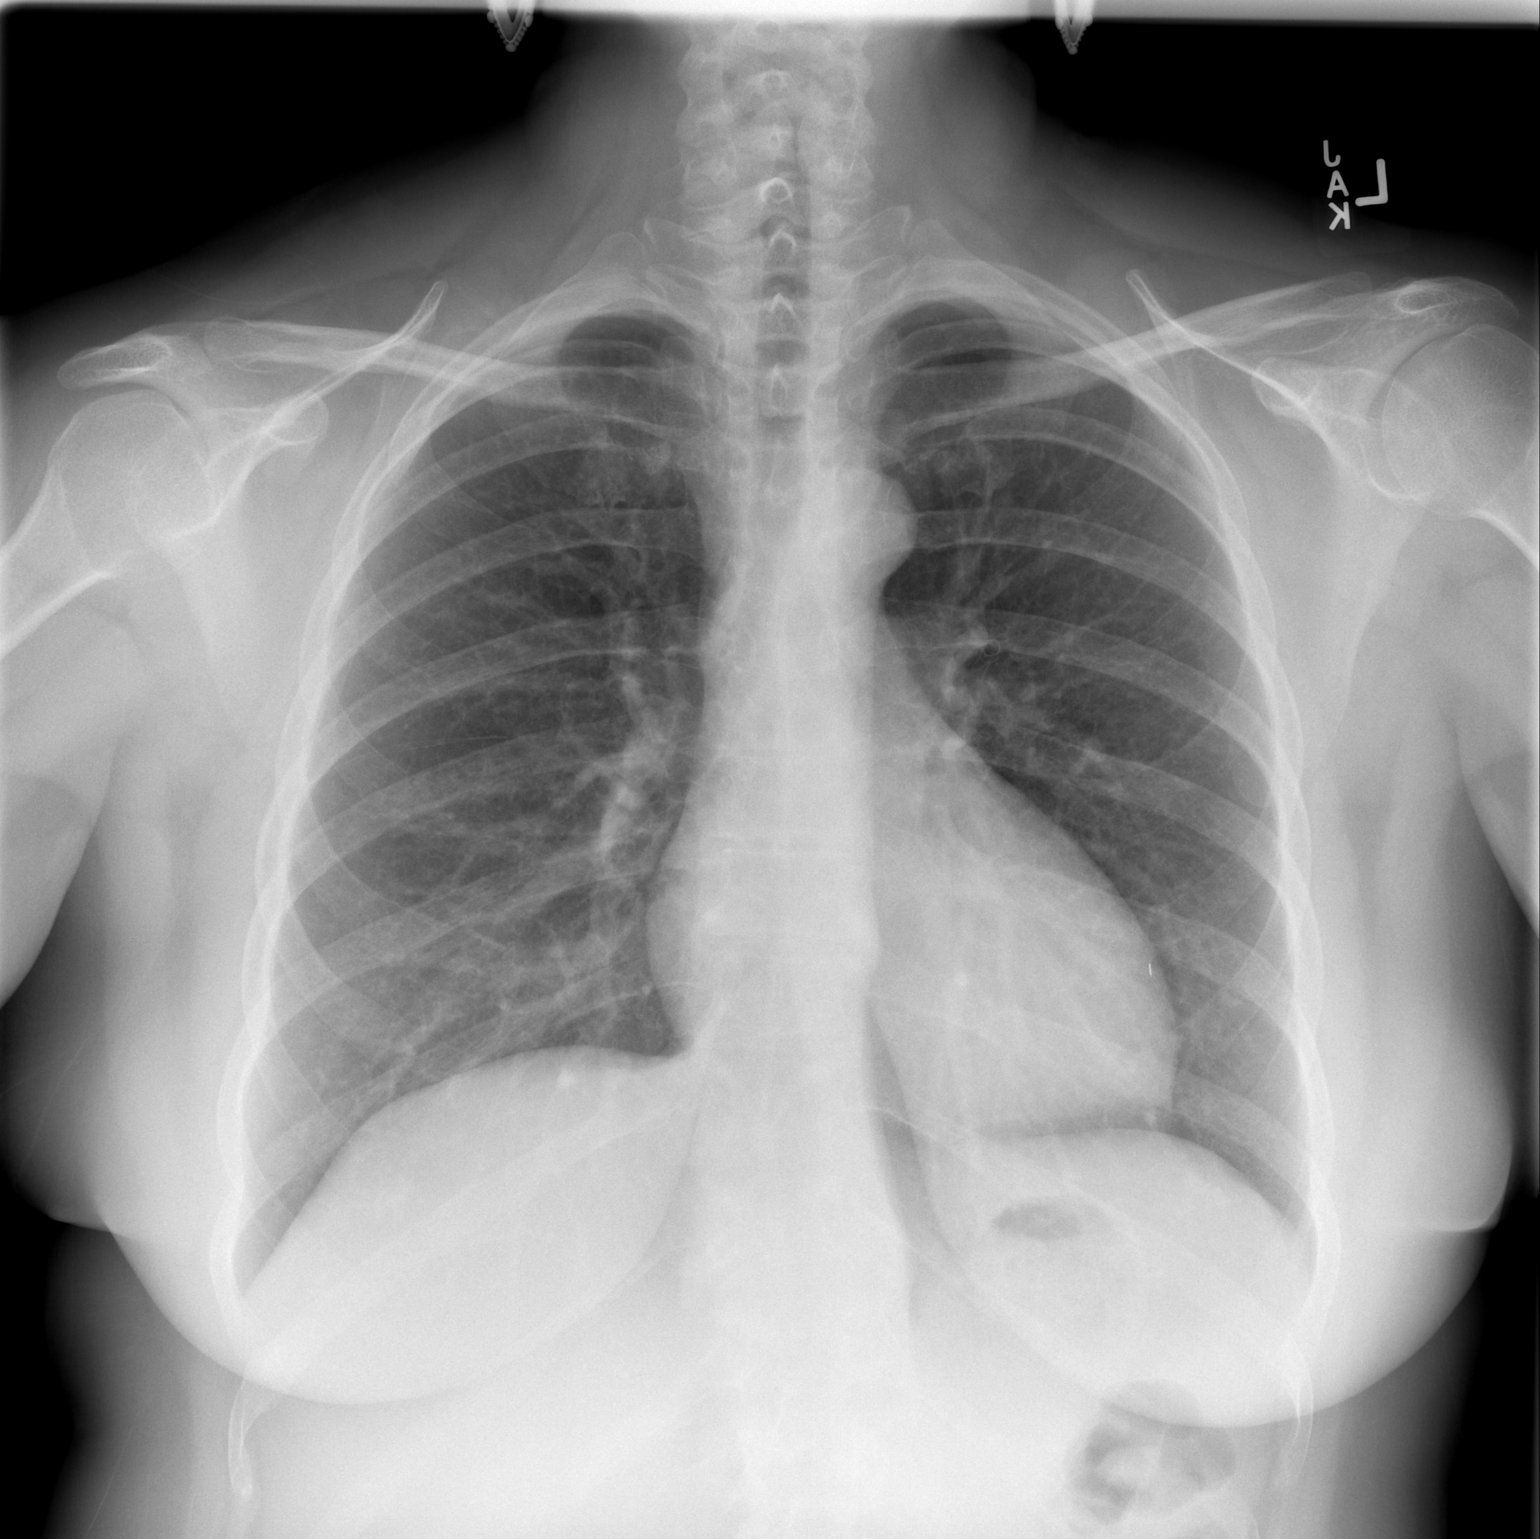

[w chest lat]
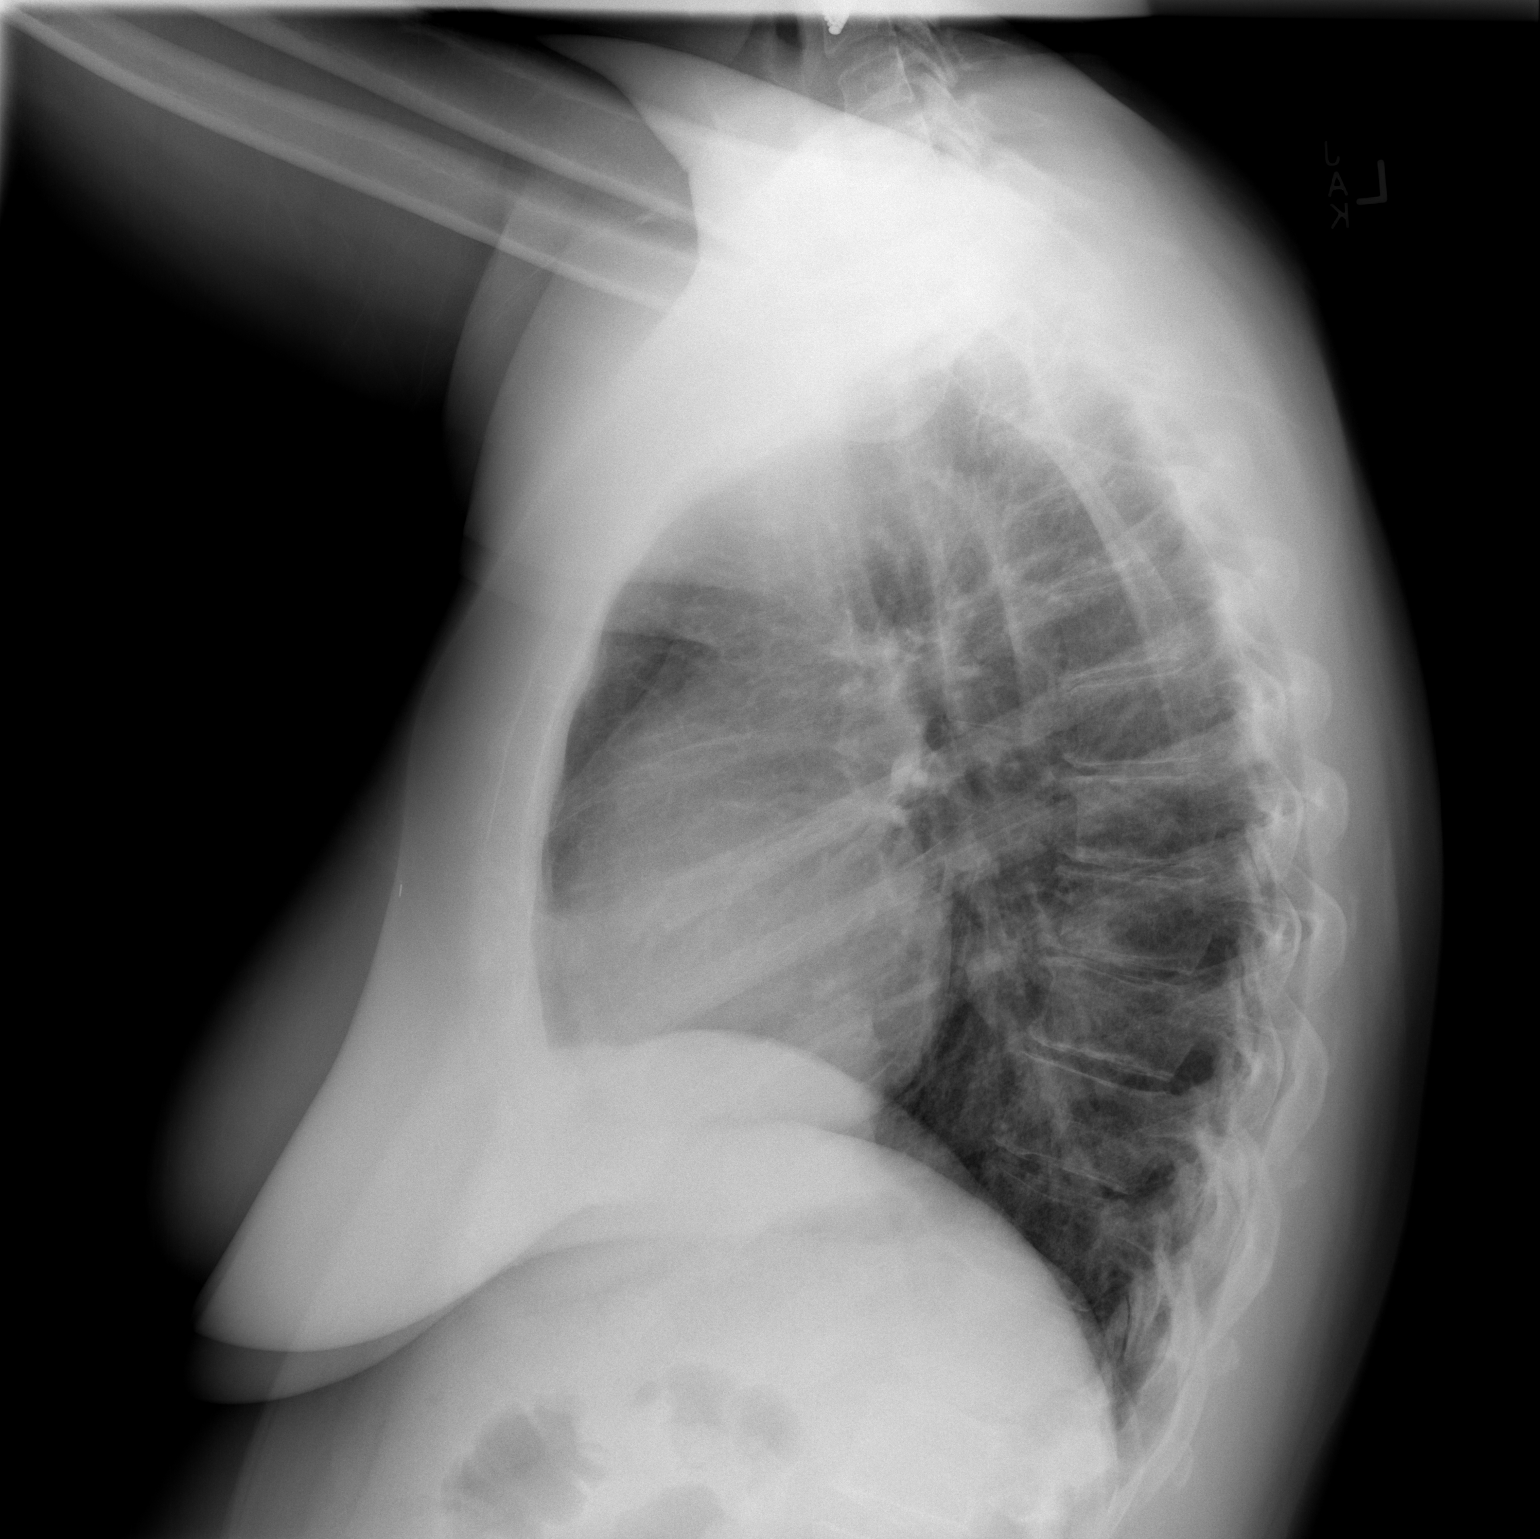

[2 of 2 positions shown; findings below may reference images not displayed]

FINDINGS: Trachea is midline. Heart size normal. Lungs are clear. No pleural
fluid. Mild degenerative changes are seen in the spine.
IMPRESSION: No acute findings.

## 2015-12-24 ENCOUNTER — Other Ambulatory Visit: Payer: Self-pay | Admitting: Hematology and Oncology

## 2015-12-24 NOTE — Telephone Encounter (Signed)
Chart reviewed.

## 2016-01-29 ENCOUNTER — Ambulatory Visit: Payer: BLUE CROSS/BLUE SHIELD | Admitting: Hematology and Oncology

## 2016-01-29 NOTE — Assessment & Plan Note (Signed)
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 Occasional hot flashes 2. Denies any myalgias or arthralgias Many of the hot flashes or myalgias and arthralgias have completely resolved  Breast Cancer Surveillance: 1. Breast exam 01/29/2016: Normal 2. Mammogram 07/22/2015 at [place] normal breast density category B  Return to clinic in 6 months for follow-up

## 2017-01-10 ENCOUNTER — Other Ambulatory Visit: Payer: Self-pay | Admitting: Hematology and Oncology

## 2017-01-11 ENCOUNTER — Telehealth: Payer: Self-pay | Admitting: Emergency Medicine

## 2017-01-11 ENCOUNTER — Telehealth: Payer: Self-pay

## 2017-01-11 ENCOUNTER — Other Ambulatory Visit: Payer: Self-pay | Admitting: Emergency Medicine

## 2017-01-11 MED ORDER — TAMOXIFEN CITRATE 20 MG PO TABS: 20.0000 mg | ORAL_TABLET | Freq: Every day | ORAL | 3 refills | Status: DC

## 2017-01-11 NOTE — Telephone Encounter (Signed)
Attempted to call pt x2 without success. Unable to leave vm. Phone just kept ringing with no answer. Pt needs to schedule f/u appt with Dr.Gudena before continuing refills on tamoxifen.

## 2017-01-11 NOTE — Telephone Encounter (Signed)
Gave patient date and time for follow up appointment.

## 2017-02-07 NOTE — Assessment & Plan Note (Signed)
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 Occasional hot flashes 2. Denies any myalgias or arthralgias Many of the hot flashes or myalgias and arthralgias have completely resolved  Breast Cancer Surveillance: 1. Breast exam  02/08/17: Normal 2. Mammogram 07/21/2016 at [place] normal breast density category B   Return to clinic in 1 year for follow-up

## 2017-02-08 ENCOUNTER — Encounter: Payer: Self-pay | Admitting: Hematology and Oncology

## 2017-02-08 ENCOUNTER — Ambulatory Visit (HOSPITAL_BASED_OUTPATIENT_CLINIC_OR_DEPARTMENT_OTHER): Payer: BLUE CROSS/BLUE SHIELD | Admitting: Hematology and Oncology

## 2017-02-08 DIAGNOSIS — C50212 Malignant neoplasm of upper-inner quadrant of left female breast: Secondary | ICD-10-CM

## 2017-02-08 DIAGNOSIS — Z79810 Long term (current) use of selective estrogen receptor modulators (SERMs): Secondary | ICD-10-CM

## 2017-02-08 DIAGNOSIS — Z170 Estrogen receptor positive status [ER+]: Secondary | ICD-10-CM | POA: Diagnosis not present

## 2017-02-08 MED ORDER — TAMOXIFEN CITRATE 20 MG PO TABS: 20.0000 mg | ORAL_TABLET | Freq: Every day | ORAL | 3 refills | Status: DC

## 2017-02-08 NOTE — Progress Notes (Signed)
Patient Care Team: Jasmine December, NP as PCP - General Jasmine December, NP as Nurse Practitioner Nicholas Lose, MD as Consulting Physician (Hematology and Oncology) Fanny Skates, MD as Consulting Physician (General Surgery) Arloa Koh, MD as Consulting Physician (Radiation Oncology)  DIAGNOSIS:  Encounter Diagnosis  Name Primary?  . Malignant neoplasm of upper-inner quadrant of left breast in female, estrogen receptor positive ([place])     SUMMARY OF ONCOLOGIC HISTORY:   Breast cancer of upper-inner quadrant of left female breast ([place])   06/20/2014 Initial Diagnosis    Left breast biopsy 11:00: IDC with DCIS grade 1, ER 100%, PR percent, Ki-67 80%, HER-2 negative, ratio 1.4 it      07/02/2014 Breast MRI    Left breast irregular mass 11:00 1.4 x 1 x 0.8 cm with non-mass enhancement medially and anteriorly extending 4.5 cm, no lymph nodes      07/17/2014 Surgery    Left breast lumpectomy: IDC grade 1, 1.7 cm, DCIS intermediate grade, for positive margin she underwent resection of the margin which was negative, to SLN negative, ER 100%, PR 100%, HER-2 negative ratio 1.48, Ki-67 18% T1 cN0 M0 Oncotype score 8 (6% ROR)      09/10/2014 - 10/04/2014 Radiation Therapy    Adjuvant XRT      11/04/2014 -  Anti-estrogen oral therapy    Tamoxifen 20 mg daily       CHIEF COMPLIANT: Follow-up on tamoxifen therapy  INTERVAL HISTORY: Mary Eaton is a 51 year old with above-mentioned history left breast cancer treated with lumpectomy and radiation is currently on tamoxifen. She is here for annual follow-up. She reports that she is tolerating tamoxifen extremely well. She denies any major hot flashes or myalgias. She does have occasional hot flashes but nothing significant. She denies any lumps or nodules in the breasts.  REVIEW OF SYSTEMS:   Constitutional: Denies fevers, chills or abnormal weight loss Eyes: Denies blurriness of vision Ears, nose, mouth, throat, and face: Denies mucositis  or sore throat Respiratory: Denies cough, dyspnea or wheezes Cardiovascular: Denies palpitation, chest discomfort Gastrointestinal:  Denies nausea, heartburn or change in bowel habits Skin: Denies abnormal skin rashes Lymphatics: Denies new lymphadenopathy or easy bruising Neurological:Denies numbness, tingling or new weaknesses Behavioral/Psych: Mood is stable, no new changes  Extremities: No lower extremity edema Breast:  denies any pain or lumps or nodules in either breasts All other systems were reviewed with the patient and are negative.  I have reviewed the past medical history, past surgical history, social history and family history with the patient and they are unchanged from previous note.  ALLERGIES:  has no allergies on file.  MEDICATIONS:  Current Outpatient Prescriptions  Medication Sig Dispense Refill  . HYDROcodone-acetaminophen (NORCO) 5-325 MG per tablet Take 1-2 tablets by mouth every 6 (six) hours as needed for moderate pain or severe pain. 30 tablet 0  . ibuprofen (ADVIL,MOTRIN) 100 MG tablet Take 100 mg by mouth every 6 (six) hours as needed for fever.    . non-metallic deodorant Jethro Poling) MISC Apply 1 application topically daily as needed.    . norethindrone (JOLIVETTE) 0.35 MG tablet Take 1 tablet by mouth daily.    . tamoxifen (NOLVADEX) 20 MG tablet Take 1 tablet (20 mg total) by mouth daily. 90 tablet 3   No current facility-administered medications for this visit.     PHYSICAL EXAMINATION: ECOG PERFORMANCE STATUS: 1 - Symptomatic but completely ambulatory  There were no vitals filed for this visit. There were no vitals filed for  this visit.  GENERAL:alert, no distress and comfortable SKIN: skin color, texture, turgor are normal, no rashes or significant lesions EYES: normal, Conjunctiva are pink and non-injected, sclera clear OROPHARYNX:no exudate, no erythema and lips, buccal mucosa, and tongue normal  NECK: supple, thyroid normal size, non-tender,  without nodularity LYMPH:  no palpable lymphadenopathy in the cervical, axillary or inguinal LUNGS: clear to auscultation and percussion with normal breathing effort HEART: regular rate & rhythm and no murmurs and no lower extremity edema ABDOMEN:abdomen soft, non-tender and normal bowel sounds MUSCULOSKELETAL:no cyanosis of digits and no clubbing  NEURO: alert & oriented x 3 with fluent speech, no focal motor/sensory deficits EXTREMITIES: No lower extremity edema BREAST: No palpable masses or nodules in either right or left breasts. No palpable axillary supraclavicular or infraclavicular adenopathy no breast tenderness or nipple discharge. (exam performed in the presence of a chaperone)  LABORATORY DATA:  I have reviewed the data as listed   Chemistry      Component Value Date/Time   NA 142 07/03/2014 1211   K 3.6 07/03/2014 1211   CO2 30 (H) 07/03/2014 1211   BUN 8.7 07/03/2014 1211   CREATININE 0.9 07/03/2014 1211      Component Value Date/Time   CALCIUM 9.6 07/03/2014 1211   ALKPHOS 79 07/03/2014 1211   AST 38 (H) 07/03/2014 1211   ALT 51 07/03/2014 1211   BILITOT 0.46 07/03/2014 1211       Lab Results  Component Value Date   WBC 8.8 07/03/2014   HGB 13.7 07/03/2014   HCT 41.8 07/03/2014   MCV 87.3 07/03/2014   PLT 247 07/03/2014   NEUTROABS 5.9 07/03/2014    ASSESSMENT & PLAN:  Breast cancer of upper-inner quadrant of left female breast 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 Occasional hot flashes 2. Denies any myalgias or arthralgias Many of the hot flashes or myalgias and arthralgias have completely resolved  Breast Cancer Surveillance: 1.  Breast exam  02/08/17: Normal 2. Mammogram 07/21/2016 at [place] normal breast density category B   Return to clinic in 1 year for follow-up  I spent 15 minutes talking to the patient of which more than half was spent in counseling and coordination of care.  No orders of the defined types were placed in this encounter.  The patient has a good understanding of the overall plan. she agrees with it. she will call with any problems that may develop before the next visit here.   Rulon Eisenmenger, MD 02/08/17

## 2018-02-06 NOTE — Progress Notes (Deleted)
Patient Care Team: Jasmine December, NP as PCP - General Jasmine December, NP as Nurse Practitioner Nicholas Lose, MD as Consulting Physician (Hematology and Oncology) Fanny Skates, MD as Consulting Physician (General Surgery) Arloa Koh, MD as Consulting Physician (Radiation Oncology)  DIAGNOSIS:  Encounter Diagnosis  Name Primary?  . Malignant neoplasm of upper-inner quadrant of left breast in female, estrogen receptor positive ([place])     SUMMARY OF ONCOLOGIC HISTORY:   Breast cancer of upper-inner quadrant of left female breast ([place])   06/20/2014 Initial Diagnosis    Left breast biopsy 11:00: IDC with DCIS grade 1, ER 100%, PR percent, Ki-67 80%, HER-2 negative, ratio 1.4 it      07/02/2014 Breast MRI    Left breast irregular mass 11:00 1.4 x 1 x 0.8 cm with non-mass enhancement medially and anteriorly extending 4.5 cm, no lymph nodes      07/17/2014 Surgery    Left breast lumpectomy: IDC grade 1, 1.7 cm, DCIS intermediate grade, for positive margin she underwent resection of the margin which was negative, to SLN negative, ER 100%, PR 100%, HER-2 negative ratio 1.48, Ki-67 18% T1 cN0 M0 Oncotype score 8 (6% ROR)      09/10/2014 - 10/04/2014 Radiation Therapy    Adjuvant XRT      11/04/2014 -  Anti-estrogen oral therapy    Tamoxifen 20 mg daily       CHIEF COMPLIANT: Follow-up on tamoxifen therapy  INTERVAL HISTORY: Mary Eaton is a 52 year old with above-mentioned history of left breast cancer treated with lumpectomy radiation is currently on tamoxifen.  She is tolerating tamoxifen reasonably well.  She has occasional hot flashes.  Denies any arthralgias or myalgias.  The hot flashes which she had previously quite severe have improved.  She denies any lumps or nodules in the breast.  REVIEW OF SYSTEMS:   Constitutional: Denies fevers, chills or abnormal weight loss Eyes: Denies blurriness of vision Ears, nose, mouth, throat, and face: Denies mucositis or sore  throat Respiratory: Denies cough, dyspnea or wheezes Cardiovascular: Denies palpitation, chest discomfort Gastrointestinal:  Denies nausea, heartburn or change in bowel habits Skin: Denies abnormal skin rashes Lymphatics: Denies new lymphadenopathy or easy bruising Neurological:Denies numbness, tingling or new weaknesses Behavioral/Psych: Mood is stable, no new changes  Extremities: No lower extremity edema Breast:  denies any pain or lumps or nodules in either breasts All other systems were reviewed with the patient and are negative.  I have reviewed the past medical history, past surgical history, social history and family history with the patient and they are unchanged from previous note.  ALLERGIES:  has no allergies on file.  MEDICATIONS:  Current Outpatient Medications  Medication Sig Dispense Refill  . ibuprofen (ADVIL,MOTRIN) 100 MG tablet Take 100 mg by mouth every 6 (six) hours as needed for fever.    . tamoxifen (NOLVADEX) 20 MG tablet Take 1 tablet (20 mg total) by mouth daily. 90 tablet 3   No current facility-administered medications for this visit.     PHYSICAL EXAMINATION: ECOG PERFORMANCE STATUS: 1 - Symptomatic but completely ambulatory  There were no vitals filed for this visit. There were no vitals filed for this visit.  GENERAL:alert, no distress and comfortable SKIN: skin color, texture, turgor are normal, no rashes or significant lesions EYES: normal, Conjunctiva are pink and non-injected, sclera clear OROPHARYNX:no exudate, no erythema and lips, buccal mucosa, and tongue normal  NECK: supple, thyroid normal size, non-tender, without nodularity LYMPH:  no palpable lymphadenopathy in the cervical, axillary  or inguinal LUNGS: clear to auscultation and percussion with normal breathing effort HEART: regular rate & rhythm and no murmurs and no lower extremity edema ABDOMEN:abdomen soft, non-tender and normal bowel sounds MUSCULOSKELETAL:no cyanosis of digits  and no clubbing  NEURO: alert & oriented x 3 with fluent speech, no focal motor/sensory deficits EXTREMITIES: No lower extremity edema BREAST: No palpable masses or nodules in either right or left breasts. No palpable axillary supraclavicular or infraclavicular adenopathy no breast tenderness or nipple discharge. (exam performed in the presence of a chaperone)  LABORATORY DATA:  I have reviewed the data as listed CMP Latest Ref Rng & Units 07/03/2014  Glucose 70 - 140 mg/dl 93  BUN 7.0 - 26.0 mg/dL 8.7  Creatinine 0.6 - 1.1 mg/dL 0.9  Sodium 136 - 145 mEq/L 142  Potassium 3.5 - 5.1 mEq/L 3.6  CO2 22 - 29 mEq/L 30(H)  Calcium 8.4 - 10.4 mg/dL 9.6  Total Protein 6.4 - 8.3 g/dL 7.5  Total Bilirubin 0.20 - 1.20 mg/dL 0.46  Alkaline Phos 40 - 150 U/L 79  AST 5 - 34 U/L 38(H)  ALT 0 - 55 U/L 51    Lab Results  Component Value Date   WBC 8.8 07/03/2014   HGB 13.7 07/03/2014   HCT 41.8 07/03/2014   MCV 87.3 07/03/2014   PLT 247 07/03/2014   NEUTROABS 5.9 07/03/2014    ASSESSMENT & PLAN:  Breast cancer of upper-inner quadrant of left female breast 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 Occasional hot flashes 2. Denies any myalgias or arthralgias Many of the hot flashes or myalgias and arthralgias have completely resolved  Breast Cancer Surveillance: 1. Breast exam 02/07/2018: Normal 2. Mammogram 07/21/2016 at [place] normal breast density category B.  Patient will need annual mammograms.  Return to clinic in 1 year for follow-up      No orders of the defined types were placed in this encounter.  The patient has a good understanding of the  overall plan. she agrees with it. she will call with any problems that may develop before the next visit here.   Harriette Ohara, MD 02/06/18

## 2018-02-06 NOTE — Assessment & Plan Note (Deleted)
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 Occasional hot flashes 2. Denies any myalgias or arthralgias Many of the hot flashes or myalgias and arthralgias have completely resolved  Breast Cancer Surveillance: 1. Breast exam 02/07/2018: Normal 2. Mammogram 07/21/2016 at [place] normal breast density category B.  Patient will need annual mammograms.  Return to clinic in 1 year for follow-up

## 2018-02-07 ENCOUNTER — Inpatient Hospital Stay: Payer: BLUE CROSS/BLUE SHIELD | Attending: Hematology and Oncology | Admitting: Hematology and Oncology

## 2018-04-12 ENCOUNTER — Other Ambulatory Visit: Payer: Self-pay | Admitting: Hematology and Oncology

## 2018-04-17 ENCOUNTER — Telehealth: Payer: Self-pay

## 2018-04-17 NOTE — Telephone Encounter (Signed)
Per 8/19 voice message return. Called patient and scheduled appointment. She agreed on time and day. Per 8/19 vm return calls. Mailed a letter to confirm this appointment and a calender enclosed

## 2018-04-24 ENCOUNTER — Telehealth: Payer: Self-pay

## 2018-04-24 NOTE — Telephone Encounter (Signed)
Returned patient call and was unable to leave a message per 8/26 vm return. Patient appointment is already scheduled for 3 pm unable to schedule on that day any later.

## 2018-05-02 ENCOUNTER — Inpatient Hospital Stay: Payer: BLUE CROSS/BLUE SHIELD | Attending: Hematology and Oncology | Admitting: Hematology and Oncology

## 2018-05-02 DIAGNOSIS — Z170 Estrogen receptor positive status [ER+]: Secondary | ICD-10-CM | POA: Insufficient documentation

## 2018-05-02 DIAGNOSIS — C50212 Malignant neoplasm of upper-inner quadrant of left female breast: Secondary | ICD-10-CM | POA: Insufficient documentation

## 2018-05-02 DIAGNOSIS — Z79810 Long term (current) use of selective estrogen receptor modulators (SERMs): Secondary | ICD-10-CM | POA: Insufficient documentation

## 2018-05-02 NOTE — Assessment & Plan Note (Deleted)
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 Occasional hot flashes 2. Denies any myalgias or arthralgias Many of the hot flashes or myalgias and arthralgias have completely resolved  Breast Cancer Surveillance: 1. Breast exam  05/02/2018: Normal 2. Mammogram December 2018 at [place]: We will request a copy of this report  Return to clinic in 1 year for follow-up

## 2018-05-04 ENCOUNTER — Telehealth: Payer: Self-pay | Admitting: Hematology and Oncology

## 2018-05-04 NOTE — Telephone Encounter (Signed)
Spoke to pt regarding vm she left earlier to r/s appts.

## 2018-05-18 ENCOUNTER — Ambulatory Visit: Payer: BLUE CROSS/BLUE SHIELD

## 2018-05-18 ENCOUNTER — Inpatient Hospital Stay (HOSPITAL_BASED_OUTPATIENT_CLINIC_OR_DEPARTMENT_OTHER): Payer: BLUE CROSS/BLUE SHIELD | Admitting: Hematology and Oncology

## 2018-05-18 ENCOUNTER — Telehealth: Payer: Self-pay | Admitting: Hematology and Oncology

## 2018-05-18 ENCOUNTER — Inpatient Hospital Stay: Payer: BLUE CROSS/BLUE SHIELD

## 2018-05-18 DIAGNOSIS — Z170 Estrogen receptor positive status [ER+]: Secondary | ICD-10-CM | POA: Diagnosis not present

## 2018-05-18 DIAGNOSIS — C50212 Malignant neoplasm of upper-inner quadrant of left female breast: Secondary | ICD-10-CM

## 2018-05-18 DIAGNOSIS — Z79810 Long term (current) use of selective estrogen receptor modulators (SERMs): Secondary | ICD-10-CM | POA: Diagnosis not present

## 2018-05-18 MED ORDER — VENLAFAXINE HCL ER 37.5 MG PO CP24: 37.5000 mg | ORAL_CAPSULE | Freq: Every day | ORAL | 3 refills | Status: DC

## 2018-05-18 MED ORDER — TAMOXIFEN CITRATE 20 MG PO TABS: 20.0000 mg | ORAL_TABLET | Freq: Every day | ORAL | 3 refills | Status: DC

## 2018-05-18 NOTE — Assessment & Plan Note (Signed)
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 Occasional hot flashes 2. Denies any myalgias or arthralgias Many of the hot flashes or myalgias and arthralgias have completely resolved  Breast Cancer Surveillance: 1. Breast exam 05/18/2018: Normal 2. Mammogram is injected at [place] normal breast density category B  Return to clinic in 1 year for follow-up

## 2018-05-18 NOTE — Telephone Encounter (Signed)
Gave avs and calendar ° °

## 2018-05-18 NOTE — Progress Notes (Signed)
Patient Care Team: Jasmine December, NP as PCP - General Jasmine December, NP as Nurse Practitioner Nicholas Lose, MD as Consulting Physician (Hematology and Oncology) Fanny Skates, MD as Consulting Physician (General Surgery) Arloa Koh, MD as Consulting Physician (Radiation Oncology)  DIAGNOSIS:  Encounter Diagnosis  Name Primary?  . Malignant neoplasm of upper-inner quadrant of left breast in female, estrogen receptor positive ([place])     SUMMARY OF ONCOLOGIC HISTORY:   Breast cancer of upper-inner quadrant of left female breast ([place])   06/20/2014 Initial Diagnosis    Left breast biopsy 11:00: IDC with DCIS grade 1, ER 100%, PR percent, Ki-67 80%, HER-2 negative, ratio 1.4 it    07/02/2014 Breast MRI    Left breast irregular mass 11:00 1.4 x 1 x 0.8 cm with non-mass enhancement medially and anteriorly extending 4.5 cm, no lymph nodes    07/17/2014 Surgery    Left breast lumpectomy: IDC grade 1, 1.7 cm, DCIS intermediate grade, for positive margin she underwent resection of the margin which was negative, to SLN negative, ER 100%, PR 100%, HER-2 negative ratio 1.48, Ki-67 18% T1 cN0 M0 Oncotype score 8 (6% ROR)    09/10/2014 - 10/04/2014 Radiation Therapy    Adjuvant XRT    11/04/2014 -  Anti-estrogen oral therapy    Tamoxifen 20 mg daily     CHIEF COMPLIANT: Follow-up on tamoxifen therapy  INTERVAL HISTORY: Mary Eaton is a 52 year old with above-mentioned history left breast cancer treated with lumpectomy radiation is currently on tamoxifen therapy.  She is tolerating tamoxifen moderately well.  Her biggest symptom is related to severe hot flashes.  She is constantly burning up according to her husband.  She had previously taken gabapentin for back issues and it did not help her with hot flashes.  She underwent back surgery recently in April and is doing quite well.  Some of her hip discomfort has improved.  REVIEW OF SYSTEMS:   Constitutional: Denies fevers, chills or  abnormal weight loss Eyes: Denies blurriness of vision Ears, nose, mouth, throat, and face: Denies mucositis or sore throat Respiratory: Denies cough, dyspnea or wheezes Cardiovascular: Denies palpitation, chest discomfort Gastrointestinal:  Denies nausea, heartburn or change in bowel habits Skin: Denies abnormal skin rashes Lymphatics: Denies new lymphadenopathy or easy bruising Neurological:Denies numbness, tingling or new weaknesses Behavioral/Psych: Mood is stable, no new changes  Extremities: No lower extremity edema   All other systems were reviewed with the patient and are negative.  I have reviewed the past medical history, past surgical history, social history and family history with the patient and they are unchanged from previous note.  ALLERGIES:  has no allergies on file.  MEDICATIONS:  Current Outpatient Medications  Medication Sig Dispense Refill  . ibuprofen (ADVIL,MOTRIN) 100 MG tablet Take 100 mg by mouth every 6 (six) hours as needed for fever.    . tamoxifen (NOLVADEX) 20 MG tablet TAKE ONE TABLET BY MOUTH DAILY  30 tablet 0   No current facility-administered medications for this visit.     PHYSICAL EXAMINATION: ECOG PERFORMANCE STATUS: 1 - Symptomatic but completely ambulatory  Vitals:   05/18/18 1505  BP: (!) 167/87  Pulse: 99  Resp: 18  Temp: 97.9 F (36.6 C)  SpO2: 98%   Filed Weights   05/18/18 1505  Weight: 232 lb 4.8 oz (105.4 kg)    GENERAL:alert, no distress and comfortable SKIN: skin color, texture, turgor are normal, no rashes or significant lesions EYES: normal, Conjunctiva are pink and non-injected, sclera clear  OROPHARYNX:no exudate, no erythema and lips, buccal mucosa, and tongue normal  NECK: supple, thyroid normal size, non-tender, without nodularity LYMPH:  no palpable lymphadenopathy in the cervical, axillary or inguinal LUNGS: clear to auscultation and percussion with normal breathing effort HEART: regular rate & rhythm and no  murmurs and no lower extremity edema ABDOMEN:abdomen soft, non-tender and normal bowel sounds MUSCULOSKELETAL:no cyanosis of digits and no clubbing  NEURO: alert & oriented x 3 with fluent speech, no focal motor/sensory deficits EXTREMITIES: No lower extremity edema   LABORATORY DATA:  I have reviewed the data as listed CMP Latest Ref Rng & Units 07/03/2014  Glucose 70 - 140 mg/dl 93  BUN 7.0 - 26.0 mg/dL 8.7  Creatinine 0.6 - 1.1 mg/dL 0.9  Sodium 136 - 145 mEq/L 142  Potassium 3.5 - 5.1 mEq/L 3.6  CO2 22 - 29 mEq/L 30(H)  Calcium 8.4 - 10.4 mg/dL 9.6  Total Protein 6.4 - 8.3 g/dL 7.5  Total Bilirubin 0.20 - 1.20 mg/dL 0.46  Alkaline Phos 40 - 150 U/L 79  AST 5 - 34 U/L 38(H)  ALT 0 - 55 U/L 51    Lab Results  Component Value Date   WBC 8.8 07/03/2014   HGB 13.7 07/03/2014   HCT 41.8 07/03/2014   MCV 87.3 07/03/2014   PLT 247 07/03/2014   NEUTROABS 5.9 07/03/2014    ASSESSMENT & PLAN:  Breast cancer of upper-inner quadrant of left female breast 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 She will let us know if it is working so we can refill it. I discussed the duration of tamoxifen therapy.  We may consider doing breast cancer index to determine if she would benefit from extended adjuvant therapy.  Breast Cancer Surveillance: 1. Breast exam 05/18/2018: Normal 2. Mammogram 09/13/2017 at [place] normal breast density category B  Return to clinic in 1 year for follow-up    No orders of the defined types were placed in this encounter.  The patient has a good understanding of the  overall plan. she agrees with it. she will call with any problems that may develop before the next visit here.   Harriette Ohara, MD 05/18/18

## 2018-05-19 ENCOUNTER — Telehealth: Payer: Self-pay | Admitting: Hematology and Oncology

## 2018-05-19 LAB — FOLLICLE STIMULATING HORMONE: FSH: 18.4 m[IU]/mL

## 2018-05-19 NOTE — Telephone Encounter (Signed)
I informed the patient that she is not in menopause with an [place] of 18.

## 2018-11-06 ENCOUNTER — Other Ambulatory Visit: Payer: Self-pay | Admitting: Hematology and Oncology

## 2019-02-05 ENCOUNTER — Other Ambulatory Visit: Payer: Self-pay | Admitting: Hematology and Oncology

## 2019-05-15 ENCOUNTER — Other Ambulatory Visit: Payer: Self-pay | Admitting: Hematology and Oncology

## 2019-05-21 ENCOUNTER — Inpatient Hospital Stay: Payer: BC Managed Care – PPO | Admitting: Hematology and Oncology

## 2019-06-01 ENCOUNTER — Inpatient Hospital Stay: Payer: BC Managed Care – PPO | Attending: Hematology and Oncology | Admitting: Hematology and Oncology

## 2019-06-01 NOTE — Assessment & Plan Note (Deleted)
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  We may consider doing breast cancer index to determine if she would benefit from extended adjuvant therapy.  Breast Cancer Surveillance: 1. Breast exam10/09/2018: Benign 2. Mammogram 09/13/2017 at [place] normal breast density category B  Return to clinic in1 yearfor follow-up

## 2019-07-20 ENCOUNTER — Telehealth: Payer: Self-pay | Admitting: Hematology and Oncology

## 2019-07-20 NOTE — Telephone Encounter (Signed)
Scheduled appt per 11/20 sch message - pt aware of appt date and time

## 2019-07-24 ENCOUNTER — Telehealth: Payer: Self-pay | Admitting: Hematology and Oncology

## 2019-07-24 NOTE — Telephone Encounter (Signed)
FAXED RECORDS TO [facility]  Joylene Igo [phone]

## 2019-08-08 NOTE — Progress Notes (Signed)
Patient Care Team: Jasmine December, NP as PCP - General Jasmine December, NP as Nurse Practitioner Nicholas Lose, MD as Consulting Physician (Hematology and Oncology) Fanny Skates, MD as Consulting Physician (General Surgery) Arloa Koh, MD (Inactive) as Consulting Physician (Radiation Oncology)  DIAGNOSIS:    ICD-10-CM   1. Malignant neoplasm of upper-inner quadrant of left breast in female, estrogen receptor positive ([place])  C50.212    Z17.0     SUMMARY OF ONCOLOGIC HISTORY: Oncology History  Breast cancer of upper-inner quadrant of left female breast ([place])  06/20/2014 Initial Diagnosis   Left breast biopsy 11:00: IDC with DCIS grade 1, ER 100%, PR percent, Ki-67 80%, HER-2 negative, ratio 1.4 it   07/02/2014 Breast MRI   Left breast irregular mass 11:00 1.4 x 1 x 0.8 cm with non-mass enhancement medially and anteriorly extending 4.5 cm, no lymph nodes   07/17/2014 Surgery   Left breast lumpectomy: IDC grade 1, 1.7 cm, DCIS intermediate grade, for positive margin she underwent resection of the margin which was negative, to SLN negative, ER 100%, PR 100%, HER-2 negative ratio 1.48, Ki-67 18% T1 cN0 M0 Oncotype score 8 (6% ROR)   09/10/2014 - 10/04/2014 Radiation Therapy   Adjuvant XRT   11/04/2014 -  Anti-estrogen oral therapy   Tamoxifen 20 mg daily     CHIEF COMPLIANT: Follow-up on tamoxifen therapy  INTERVAL HISTORY: Mary Eaton is a 53 y.o. with above-mentioned history of left breast cancer treated with lumpectomy, radiation, and who is currently on tamoxifen therapy. She presents to the clinic today for follow-up.  Hot flashes have improved with Effexor 37.5 mg daily.  However she gets 2-3 times very intense hot flashes still.  Denies any other symptoms or side effects.  Denies any lumps or nodules in the breast.  REVIEW OF SYSTEMS:   As above all systems negative ALLERGIES:  has no allergies on file.  MEDICATIONS:  Current Outpatient Medications  Medication Sig  Dispense Refill  . ibuprofen (ADVIL,MOTRIN) 100 MG tablet Take 100 mg by mouth every 6 (six) hours as needed for fever.    Marland Kitchen lisinopril (ZESTRIL) 40 MG tablet Take 1 tablet (40 mg total) by mouth daily.    . Meloxicam 15 MG TBDP Take 15 mg by mouth daily. 30 tablet   . tamoxifen (NOLVADEX) 20 MG tablet TAKE ONE TABLET BY MOUTH ONE TIME DAILY  90 tablet 0  . venlafaxine XR (EFFEXOR-XR) 37.5 MG 24 hr capsule TAKE ONE CAPSULE BY MOUTH ONE TIME DAILY with breakfast 90 capsule 0   No current facility-administered medications for this visit.    PHYSICAL EXAMINATION: ECOG PERFORMANCE STATUS: 1 - Symptomatic but completely ambulatory  Vitals:   08/09/19 1436  BP: 139/83  Pulse: 91  Resp: 18  Temp: 97.9 F (36.6 C)  SpO2: 98%   Filed Weights   08/09/19 1436  Weight: 242 lb 11.2 oz (110.1 kg)    GENERAL: alert, no distress and comfortable SKIN: skin color, texture, turgor are normal, no rashes or significant lesions EYES: normal, Conjunctiva are pink and non-injected, sclera clear OROPHARYNX: no exudate, no erythema and lips, buccal mucosa, and tongue normal  NECK: supple, thyroid normal size, non-tender, without nodularity LYMPH: no palpable lymphadenopathy in the cervical, axillary or inguinal LUNGS: clear to auscultation and percussion with normal breathing effort HEART: regular rate & rhythm and no murmurs and no lower extremity edema ABDOMEN: abdomen soft, non-tender and normal bowel sounds MUSCULOSKELETAL: no cyanosis of digits and no clubbing  NEURO: alert &  oriented x 3 with fluent speech, no focal motor/sensory deficits EXTREMITIES: No lower extremity edema BREAST: No palpable masses or nodules in either right or left breasts. No palpable axillary supraclavicular or infraclavicular adenopathy no breast tenderness or nipple discharge. (exam performed in the presence of a chaperone)  LABORATORY DATA:  I have reviewed the data as listed CMP Latest Ref Rng & Units 07/03/2014   Glucose 70 - 140 mg/dl 93  BUN 7.0 - 26.0 mg/dL 8.7  Creatinine 0.6 - 1.1 mg/dL 0.9  Sodium 136 - 145 mEq/L 142  Potassium 3.5 - 5.1 mEq/L 3.6  CO2 22 - 29 mEq/L 30(H)  Calcium 8.4 - 10.4 mg/dL 9.6  Total Protein 6.4 - 8.3 g/dL 7.5  Total Bilirubin 0.20 - 1.20 mg/dL 0.46  Alkaline Phos 40 - 150 U/L 79  AST 5 - 34 U/L 38(H)  ALT 0 - 55 U/L 51    Lab Results  Component Value Date   WBC 8.8 07/03/2014   HGB 13.7 07/03/2014   HCT 41.8 07/03/2014   MCV 87.3 07/03/2014   PLT 247 07/03/2014   NEUTROABS 5.9 07/03/2014    ASSESSMENT & PLAN:  Breast cancer of upper-inner quadrant of left female breast 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  I discussed the duration of tamoxifen therapy.    Because of severity of side effects we will perform breast cancer index.  If it does show benefit to antiestrogen therapy then we will have her start taking half a tablet daily.  If she continues to have severe hot flashes then we can increase the dosage of Effexor at that time. I instructed her to start taking half a tablet of tamoxifen from today onwards.  Breast Cancer Surveillance: 1. Breast exam Benign 2. Mammogram  07/10/2019 at [place] normal breast density category B  Return to clinic in1 yearfor follow-up   No orders of the defined types were placed in this encounter.  The patient has a good understanding of the overall plan. she agrees with it. she will call with any problems that may develop before the next visit here.  Nicholas Lose, MD 08/09/2019  Julious Oka Dorshimer, am acting as scribe for Dr. Nicholas Lose.   I have reviewed the above documentation for accuracy and completeness, and I agree with the above.

## 2019-08-09 ENCOUNTER — Other Ambulatory Visit: Payer: Self-pay

## 2019-08-09 ENCOUNTER — Telehealth: Payer: Self-pay | Admitting: Hematology and Oncology

## 2019-08-09 ENCOUNTER — Inpatient Hospital Stay: Payer: BC Managed Care – PPO | Attending: Hematology and Oncology | Admitting: Hematology and Oncology

## 2019-08-09 DIAGNOSIS — Z170 Estrogen receptor positive status [ER+]: Secondary | ICD-10-CM | POA: Insufficient documentation

## 2019-08-09 DIAGNOSIS — C50212 Malignant neoplasm of upper-inner quadrant of left female breast: Secondary | ICD-10-CM | POA: Diagnosis not present

## 2019-08-09 DIAGNOSIS — Z79810 Long term (current) use of selective estrogen receptor modulators (SERMs): Secondary | ICD-10-CM | POA: Insufficient documentation

## 2019-08-09 DIAGNOSIS — N951 Menopausal and female climacteric states: Secondary | ICD-10-CM | POA: Insufficient documentation

## 2019-08-09 MED ORDER — LISINOPRIL 40 MG PO TABS: 40.0000 mg | ORAL_TABLET | Freq: Every day | ORAL | Status: AC

## 2019-08-09 MED ORDER — MELOXICAM 15 MG PO TBDP: 15.0000 mg | ORAL_TABLET | Freq: Every day | ORAL | Status: AC

## 2019-08-09 NOTE — Telephone Encounter (Signed)
I could not reach patient regarding schedule will mail 

## 2019-08-09 NOTE — Assessment & Plan Note (Signed)
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  I discussed the duration of tamoxifen therapy.  We may consider doing breast cancer index to determine if she would benefit from extended adjuvant therapy.  Breast Cancer Surveillance: 1. Breast exam12/05/2019: Benign 2. Mammogram  January 2020 at [facility] normal breast density category B  Return to clinic in1 yearfor follow-up

## 2019-08-15 ENCOUNTER — Telehealth: Payer: Self-pay | Admitting: *Deleted

## 2019-08-15 NOTE — Telephone Encounter (Signed)
Ordered [facility]CI per D.r Gudena. Faxed requisition to pathology and [facility]. 

## 2019-08-16 ENCOUNTER — Ambulatory Visit: Payer: BC Managed Care – PPO | Admitting: Hematology and Oncology

## 2019-09-05 ENCOUNTER — Telehealth: Payer: Self-pay | Admitting: *Deleted

## 2019-09-05 NOTE — Telephone Encounter (Signed)
Received notification that pt has declined BCI. Dr. Lindi Adie notified.

## 2019-10-12 ENCOUNTER — Encounter: Payer: Self-pay | Admitting: Hematology and Oncology

## 2020-08-08 ENCOUNTER — Inpatient Hospital Stay: Payer: BC Managed Care – PPO | Attending: Hematology and Oncology | Admitting: Hematology and Oncology

## 2020-08-08 NOTE — Assessment & Plan Note (Deleted)
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  I discussed the duration of tamoxifen therapy.   Patient declined sending for BCI.  Therefore our treatment plan is 10 years unless the side effects are intolerable. I instructed her to start taking half a tablet of tamoxifen from today onwards.  Breast Cancer Surveillance: 1. Breast exam12/05/2020: Benign 2. Mammogram 11/10/2020at [facility] [place] normal breast density category B  Return to clinic in1 yearfor follow-up
